# Patient Record
Sex: Female | Born: 1945 | Race: Black or African American | Hispanic: No | Smoking: Never smoker
Health system: Southern US, Community
[De-identification: ages and names within clinical notes are randomized; demographics above are authoritative.]

## PROBLEM LIST (undated history)

## (undated) DIAGNOSIS — N189 Chronic kidney disease, unspecified: Secondary | ICD-10-CM

## (undated) DIAGNOSIS — E785 Hyperlipidemia, unspecified: Secondary | ICD-10-CM

## (undated) DIAGNOSIS — I10 Essential (primary) hypertension: Secondary | ICD-10-CM

## (undated) HISTORY — DX: Chronic kidney disease, unspecified: N18.9

## (undated) HISTORY — DX: Hyperlipidemia, unspecified: E78.5

## (undated) HISTORY — DX: Essential (primary) hypertension: I10

---

## 2004-09-01 ENCOUNTER — Encounter: Admission: RE | Admit: 2004-09-01 | Discharge: 2004-11-30 | Payer: Self-pay | Admitting: Internal Medicine

## 2008-03-24 ENCOUNTER — Inpatient Hospital Stay (HOSPITAL_COMMUNITY): Admission: EM | Admit: 2008-03-24 | Discharge: 2008-04-04 | Payer: Self-pay | Admitting: Emergency Medicine

## 2008-04-01 ENCOUNTER — Encounter (INDEPENDENT_AMBULATORY_CARE_PROVIDER_SITE_OTHER): Payer: Self-pay | Admitting: Internal Medicine

## 2008-04-15 ENCOUNTER — Ambulatory Visit: Payer: Self-pay | Admitting: Internal Medicine

## 2008-05-02 ENCOUNTER — Ambulatory Visit: Payer: Self-pay | Admitting: Internal Medicine

## 2008-05-27 ENCOUNTER — Ambulatory Visit: Payer: Self-pay | Admitting: Internal Medicine

## 2008-08-23 ENCOUNTER — Ambulatory Visit: Payer: Self-pay | Admitting: Internal Medicine

## 2008-10-17 ENCOUNTER — Ambulatory Visit: Payer: Self-pay | Admitting: Internal Medicine

## 2009-03-07 ENCOUNTER — Ambulatory Visit: Payer: Self-pay | Admitting: Internal Medicine

## 2009-09-05 ENCOUNTER — Ambulatory Visit: Payer: Self-pay | Admitting: Internal Medicine

## 2010-04-02 ENCOUNTER — Other Ambulatory Visit: Payer: Self-pay | Admitting: Internal Medicine

## 2010-04-03 ENCOUNTER — Encounter (INDEPENDENT_AMBULATORY_CARE_PROVIDER_SITE_OTHER): Payer: Medicare Other | Admitting: Internal Medicine

## 2010-04-03 DIAGNOSIS — Z23 Encounter for immunization: Secondary | ICD-10-CM

## 2010-04-03 DIAGNOSIS — E119 Type 2 diabetes mellitus without complications: Secondary | ICD-10-CM

## 2010-04-03 DIAGNOSIS — I10 Essential (primary) hypertension: Secondary | ICD-10-CM

## 2010-04-27 ENCOUNTER — Ambulatory Visit: Payer: Medicare Other | Admitting: Internal Medicine

## 2010-05-14 LAB — RENAL FUNCTION PANEL
Albumin: 1.5 g/dL — ABNORMAL LOW (ref 3.5–5.2)
Albumin: 1.6 g/dL — ABNORMAL LOW (ref 3.5–5.2)
Albumin: 1.7 g/dL — ABNORMAL LOW (ref 3.5–5.2)
BUN: 50 mg/dL — ABNORMAL HIGH (ref 6–23)
BUN: 68 mg/dL — ABNORMAL HIGH (ref 6–23)
CO2: 19 mEq/L (ref 19–32)
Calcium: 8.1 mg/dL — ABNORMAL LOW (ref 8.4–10.5)
Calcium: 8.3 mg/dL — ABNORMAL LOW (ref 8.4–10.5)
Chloride: 119 mEq/L — ABNORMAL HIGH (ref 96–112)
Chloride: 121 mEq/L — ABNORMAL HIGH (ref 96–112)
Creatinine, Ser: 4.29 mg/dL — ABNORMAL HIGH (ref 0.4–1.2)
GFR calc Af Amer: 11 mL/min — ABNORMAL LOW (ref >60)
GFR calc Af Amer: 15 mL/min — ABNORMAL LOW (ref >60)
GFR calc non Af Amer: 12 mL/min — ABNORMAL LOW (ref >60)
Glucose, Bld: 200 mg/dL — ABNORMAL HIGH (ref 70–99)
Glucose, Bld: 262 mg/dL — ABNORMAL HIGH (ref 70–99)
Glucose, Bld: 92 mg/dL (ref 70–99)
Phosphorus: 3.8 mg/dL (ref 2.3–4.6)
Phosphorus: 4.1 mg/dL (ref 2.3–4.6)
Potassium: 3.2 mEq/L — ABNORMAL LOW (ref 3.5–5.1)
Potassium: 3.7 mEq/L (ref 3.5–5.1)
Sodium: 144 mEq/L (ref 135–145)
Sodium: 144 mEq/L (ref 135–145)
Sodium: 147 mEq/L — ABNORMAL HIGH (ref 135–145)

## 2010-05-14 LAB — GLUCOSE, CAPILLARY
Glucose-Capillary: 127 mg/dL — ABNORMAL HIGH (ref 70–99)
Glucose-Capillary: 132 mg/dL — ABNORMAL HIGH (ref 70–99)
Glucose-Capillary: 152 mg/dL — ABNORMAL HIGH (ref 70–99)
Glucose-Capillary: 174 mg/dL — ABNORMAL HIGH (ref 70–99)
Glucose-Capillary: 231 mg/dL — ABNORMAL HIGH (ref 70–99)
Glucose-Capillary: 233 mg/dL — ABNORMAL HIGH (ref 70–99)
Glucose-Capillary: 257 mg/dL — ABNORMAL HIGH (ref 70–99)
Glucose-Capillary: 270 mg/dL — ABNORMAL HIGH (ref 70–99)

## 2010-05-14 LAB — CBC
HCT: 29.5 % — ABNORMAL LOW (ref 36.0–46.0)
Hemoglobin: 10 g/dL — ABNORMAL LOW (ref 12.0–15.0)
MCHC: 34 g/dL (ref 30.0–36.0)
MCV: 75.9 fL — ABNORMAL LOW (ref 78.0–100.0)
MCV: 76.6 fL — ABNORMAL LOW (ref 78.0–100.0)
Platelets: 205 10*3/uL (ref 150–400)
Platelets: 216 10*3/uL (ref 150–400)
RBC: 3.89 MIL/uL (ref 3.87–5.11)
WBC: 12.1 10*3/uL — ABNORMAL HIGH (ref 4.0–10.5)
WBC: 12.3 10*3/uL — ABNORMAL HIGH (ref 4.0–10.5)
WBC: 12.5 10*3/uL — ABNORMAL HIGH (ref 4.0–10.5)

## 2010-05-14 LAB — BASIC METABOLIC PANEL
CO2: 17 mEq/L — ABNORMAL LOW (ref 19–32)
Calcium: 8.3 mg/dL — ABNORMAL LOW (ref 8.4–10.5)
Chloride: 117 mEq/L — ABNORMAL HIGH (ref 96–112)
Creatinine, Ser: 4.56 mg/dL — ABNORMAL HIGH (ref 0.4–1.2)
Glucose, Bld: 258 mg/dL — ABNORMAL HIGH (ref 70–99)
Sodium: 143 mEq/L (ref 135–145)

## 2010-05-14 LAB — URINALYSIS, MICROSCOPIC ONLY
Glucose, UA: NEGATIVE mg/dL
Ketones, ur: NEGATIVE mg/dL
Protein, ur: 30 mg/dL — AB
Urobilinogen, UA: 0.2 mg/dL (ref 0.0–1.0)

## 2010-05-14 LAB — IRON AND TIBC: Iron: 33 ug/dL — ABNORMAL LOW (ref 42–135)

## 2010-05-14 LAB — PTH, INTACT AND CALCIUM
Calcium, Total (PTH): 7.8 mg/dL — ABNORMAL LOW (ref 8.4–10.5)
PTH: 95.5 pg/mL — ABNORMAL HIGH (ref 14.0–72.0)

## 2010-05-14 LAB — ANTI-NEUTROPHIL ANTIBODY

## 2010-05-14 LAB — C3 COMPLEMENT: C3 Complement: 181 mg/dL (ref 88–201)

## 2010-05-14 LAB — PROTEIN, URINE, RANDOM: Total Protein, Urine: 62 mg/dL

## 2010-05-14 LAB — HEMOGLOBIN A1C: Hgb A1c MFr Bld: 14.3 % — ABNORMAL HIGH (ref 4.6–6.1)

## 2010-05-19 LAB — URINE MICROSCOPIC-ADD ON

## 2010-05-19 LAB — GLUCOSE, CAPILLARY
Glucose-Capillary: 109 mg/dL — ABNORMAL HIGH (ref 70–99)
Glucose-Capillary: 110 mg/dL — ABNORMAL HIGH (ref 70–99)
Glucose-Capillary: 127 mg/dL — ABNORMAL HIGH (ref 70–99)
Glucose-Capillary: 151 mg/dL — ABNORMAL HIGH (ref 70–99)
Glucose-Capillary: 159 mg/dL — ABNORMAL HIGH (ref 70–99)
Glucose-Capillary: 176 mg/dL — ABNORMAL HIGH (ref 70–99)
Glucose-Capillary: 187 mg/dL — ABNORMAL HIGH (ref 70–99)
Glucose-Capillary: 195 mg/dL — ABNORMAL HIGH (ref 70–99)
Glucose-Capillary: 243 mg/dL — ABNORMAL HIGH (ref 70–99)
Glucose-Capillary: 248 mg/dL — ABNORMAL HIGH (ref 70–99)
Glucose-Capillary: 266 mg/dL — ABNORMAL HIGH (ref 70–99)
Glucose-Capillary: 270 mg/dL — ABNORMAL HIGH (ref 70–99)
Glucose-Capillary: 291 mg/dL — ABNORMAL HIGH (ref 70–99)
Glucose-Capillary: 294 mg/dL — ABNORMAL HIGH (ref 70–99)
Glucose-Capillary: 296 mg/dL — ABNORMAL HIGH (ref 70–99)
Glucose-Capillary: 302 mg/dL — ABNORMAL HIGH (ref 70–99)
Glucose-Capillary: 306 mg/dL — ABNORMAL HIGH (ref 70–99)
Glucose-Capillary: 335 mg/dL — ABNORMAL HIGH (ref 70–99)
Glucose-Capillary: 344 mg/dL — ABNORMAL HIGH (ref 70–99)
Glucose-Capillary: 347 mg/dL — ABNORMAL HIGH (ref 70–99)
Glucose-Capillary: 363 mg/dL — ABNORMAL HIGH (ref 70–99)
Glucose-Capillary: 370 mg/dL — ABNORMAL HIGH (ref 70–99)
Glucose-Capillary: 400 mg/dL — ABNORMAL HIGH (ref 70–99)
Glucose-Capillary: 497 mg/dL — ABNORMAL HIGH (ref 70–99)
Glucose-Capillary: 84 mg/dL (ref 70–99)
Glucose-Capillary: 99 mg/dL (ref 70–99)
Glucose-Capillary: >600 mg/dL (ref 70–99)
Glucose-Capillary: >600 mg/dL (ref 70–99)
Glucose-Capillary: >600 mg/dL (ref 70–99)
Glucose-Capillary: >600 mg/dL (ref 70–99)

## 2010-05-19 LAB — BASIC METABOLIC PANEL
BUN: 34 mg/dL — ABNORMAL HIGH (ref 6–23)
BUN: 36 mg/dL — ABNORMAL HIGH (ref 6–23)
BUN: 36 mg/dL — ABNORMAL HIGH (ref 6–23)
BUN: 38 mg/dL — ABNORMAL HIGH (ref 6–23)
BUN: 39 mg/dL — ABNORMAL HIGH (ref 6–23)
BUN: 43 mg/dL — ABNORMAL HIGH (ref 6–23)
BUN: 47 mg/dL — ABNORMAL HIGH (ref 6–23)
BUN: 48 mg/dL — ABNORMAL HIGH (ref 6–23)
BUN: 57 mg/dL — ABNORMAL HIGH (ref 6–23)
BUN: 69 mg/dL — ABNORMAL HIGH (ref 6–23)
BUN: 76 mg/dL — ABNORMAL HIGH (ref 6–23)
BUN: 78 mg/dL — ABNORMAL HIGH (ref 6–23)
BUN: 94 mg/dL — ABNORMAL HIGH (ref 6–23)
CO2: 14 mEq/L — ABNORMAL LOW (ref 19–32)
CO2: 16 mEq/L — ABNORMAL LOW (ref 19–32)
CO2: 18 mEq/L — ABNORMAL LOW (ref 19–32)
CO2: 18 mEq/L — ABNORMAL LOW (ref 19–32)
CO2: 19 mEq/L (ref 19–32)
CO2: 19 mEq/L (ref 19–32)
CO2: 19 mEq/L (ref 19–32)
CO2: 20 mEq/L (ref 19–32)
CO2: 20 mEq/L (ref 19–32)
CO2: 20 mEq/L (ref 19–32)
CO2: 21 mEq/L (ref 19–32)
Calcium: 7.8 mg/dL — ABNORMAL LOW (ref 8.4–10.5)
Calcium: 7.8 mg/dL — ABNORMAL LOW (ref 8.4–10.5)
Calcium: 8 mg/dL — ABNORMAL LOW (ref 8.4–10.5)
Calcium: 8.1 mg/dL — ABNORMAL LOW (ref 8.4–10.5)
Calcium: 8.1 mg/dL — ABNORMAL LOW (ref 8.4–10.5)
Calcium: 8.2 mg/dL — ABNORMAL LOW (ref 8.4–10.5)
Calcium: 8.2 mg/dL — ABNORMAL LOW (ref 8.4–10.5)
Calcium: 8.3 mg/dL — ABNORMAL LOW (ref 8.4–10.5)
Calcium: 8.3 mg/dL — ABNORMAL LOW (ref 8.4–10.5)
Calcium: 8.4 mg/dL (ref 8.4–10.5)
Calcium: 8.6 mg/dL (ref 8.4–10.5)
Calcium: 8.6 mg/dL (ref 8.4–10.5)
Calcium: 8.7 mg/dL (ref 8.4–10.5)
Calcium: 8.8 mg/dL (ref 8.4–10.5)
Chloride: 112 mEq/L (ref 96–112)
Chloride: 117 mEq/L — ABNORMAL HIGH (ref 96–112)
Chloride: 119 mEq/L — ABNORMAL HIGH (ref 96–112)
Chloride: 119 mEq/L — ABNORMAL HIGH (ref 96–112)
Chloride: 120 mEq/L — ABNORMAL HIGH (ref 96–112)
Chloride: 121 mEq/L — ABNORMAL HIGH (ref 96–112)
Chloride: 121 mEq/L — ABNORMAL HIGH (ref 96–112)
Chloride: 126 mEq/L — ABNORMAL HIGH (ref 96–112)
Chloride: >130 mEq/L — ABNORMAL HIGH (ref 96–112)
Chloride: >130 mEq/L — ABNORMAL HIGH (ref 96–112)
Chloride: >130 mEq/L — ABNORMAL HIGH (ref 96–112)
Chloride: >130 mEq/L — ABNORMAL HIGH (ref 96–112)
Chloride: >130 mEq/L — ABNORMAL HIGH (ref 96–112)
Chloride: >130 mEq/L — ABNORMAL HIGH (ref 96–112)
Creatinine, Ser: 2.55 mg/dL — ABNORMAL HIGH (ref 0.4–1.2)
Creatinine, Ser: 2.7 mg/dL — ABNORMAL HIGH (ref 0.4–1.2)
Creatinine, Ser: 2.78 mg/dL — ABNORMAL HIGH (ref 0.4–1.2)
Creatinine, Ser: 2.84 mg/dL — ABNORMAL HIGH (ref 0.4–1.2)
Creatinine, Ser: 3.19 mg/dL — ABNORMAL HIGH (ref 0.4–1.2)
Creatinine, Ser: 3.21 mg/dL — ABNORMAL HIGH (ref 0.4–1.2)
Creatinine, Ser: 3.4 mg/dL — ABNORMAL HIGH (ref 0.4–1.2)
Creatinine, Ser: 3.6 mg/dL — ABNORMAL HIGH (ref 0.4–1.2)
Creatinine, Ser: 3.74 mg/dL — ABNORMAL HIGH (ref 0.4–1.2)
Creatinine, Ser: 3.78 mg/dL — ABNORMAL HIGH (ref 0.4–1.2)
Creatinine, Ser: 5.26 mg/dL — ABNORMAL HIGH (ref 0.4–1.2)
GFR calc Af Amer: 10 mL/min — ABNORMAL LOW (ref >60)
GFR calc Af Amer: 10 mL/min — ABNORMAL LOW (ref >60)
GFR calc Af Amer: 13 mL/min — ABNORMAL LOW (ref >60)
GFR calc Af Amer: 15 mL/min — ABNORMAL LOW (ref >60)
GFR calc Af Amer: 15 mL/min — ABNORMAL LOW (ref >60)
GFR calc Af Amer: 15 mL/min — ABNORMAL LOW (ref >60)
GFR calc Af Amer: 17 mL/min — ABNORMAL LOW (ref >60)
GFR calc Af Amer: 17 mL/min — ABNORMAL LOW (ref >60)
GFR calc Af Amer: 18 mL/min — ABNORMAL LOW (ref >60)
GFR calc Af Amer: 21 mL/min — ABNORMAL LOW (ref >60)
GFR calc Af Amer: 21 mL/min — ABNORMAL LOW (ref >60)
GFR calc Af Amer: 22 mL/min — ABNORMAL LOW (ref >60)
GFR calc Af Amer: 22 mL/min — ABNORMAL LOW (ref >60)
GFR calc Af Amer: 23 mL/min — ABNORMAL LOW (ref >60)
GFR calc non Af Amer: 11 mL/min — ABNORMAL LOW (ref >60)
GFR calc non Af Amer: 12 mL/min — ABNORMAL LOW (ref >60)
GFR calc non Af Amer: 12 mL/min — ABNORMAL LOW (ref >60)
GFR calc non Af Amer: 13 mL/min — ABNORMAL LOW (ref >60)
GFR calc non Af Amer: 14 mL/min — ABNORMAL LOW (ref >60)
GFR calc non Af Amer: 15 mL/min — ABNORMAL LOW (ref >60)
GFR calc non Af Amer: 17 mL/min — ABNORMAL LOW (ref >60)
GFR calc non Af Amer: 17 mL/min — ABNORMAL LOW (ref >60)
GFR calc non Af Amer: 18 mL/min — ABNORMAL LOW (ref >60)
GFR calc non Af Amer: 18 mL/min — ABNORMAL LOW (ref >60)
GFR calc non Af Amer: 19 mL/min — ABNORMAL LOW (ref >60)
GFR calc non Af Amer: 19 mL/min — ABNORMAL LOW (ref >60)
GFR calc non Af Amer: 8 mL/min — ABNORMAL LOW (ref >60)
GFR calc non Af Amer: 9 mL/min — ABNORMAL LOW (ref >60)
Glucose, Bld: 197 mg/dL — ABNORMAL HIGH (ref 70–99)
Glucose, Bld: 201 mg/dL — ABNORMAL HIGH (ref 70–99)
Glucose, Bld: 203 mg/dL — ABNORMAL HIGH (ref 70–99)
Glucose, Bld: 215 mg/dL — ABNORMAL HIGH (ref 70–99)
Glucose, Bld: 253 mg/dL — ABNORMAL HIGH (ref 70–99)
Glucose, Bld: 254 mg/dL — ABNORMAL HIGH (ref 70–99)
Glucose, Bld: 280 mg/dL — ABNORMAL HIGH (ref 70–99)
Glucose, Bld: 375 mg/dL — ABNORMAL HIGH (ref 70–99)
Glucose, Bld: 432 mg/dL — ABNORMAL HIGH (ref 70–99)
Glucose, Bld: 482 mg/dL — ABNORMAL HIGH (ref 70–99)
Glucose, Bld: 522 mg/dL (ref 70–99)
Potassium: 3.4 mEq/L — ABNORMAL LOW (ref 3.5–5.1)
Potassium: 3.6 mEq/L (ref 3.5–5.1)
Potassium: 3.7 mEq/L (ref 3.5–5.1)
Potassium: 3.8 mEq/L (ref 3.5–5.1)
Potassium: 3.9 mEq/L (ref 3.5–5.1)
Potassium: 4 mEq/L (ref 3.5–5.1)
Potassium: 4.2 mEq/L (ref 3.5–5.1)
Potassium: 4.3 mEq/L (ref 3.5–5.1)
Potassium: 4.3 mEq/L (ref 3.5–5.1)
Potassium: 4.4 mEq/L (ref 3.5–5.1)
Potassium: 4.5 mEq/L (ref 3.5–5.1)
Potassium: 4.8 mEq/L (ref 3.5–5.1)
Potassium: 5 mEq/L (ref 3.5–5.1)
Potassium: 5.7 mEq/L — ABNORMAL HIGH (ref 3.5–5.1)
Sodium: 136 mEq/L (ref 135–145)
Sodium: 136 mEq/L (ref 135–145)
Sodium: 141 mEq/L (ref 135–145)
Sodium: 149 mEq/L — ABNORMAL HIGH (ref 135–145)
Sodium: 150 mEq/L — ABNORMAL HIGH (ref 135–145)
Sodium: 152 mEq/L — ABNORMAL HIGH (ref 135–145)
Sodium: 160 mEq/L — ABNORMAL HIGH (ref 135–145)
Sodium: 160 mEq/L — ABNORMAL HIGH (ref 135–145)
Sodium: 161 mEq/L (ref 135–145)
Sodium: 161 mEq/L (ref 135–145)
Sodium: 163 mEq/L (ref 135–145)
Sodium: 165 mEq/L (ref 135–145)
Sodium: 173 mEq/L (ref 135–145)

## 2010-05-19 LAB — MAGNESIUM
Magnesium: 2.3 mg/dL (ref 1.5–2.5)
Magnesium: 3.4 mg/dL — ABNORMAL HIGH (ref 1.5–2.5)
Magnesium: 3.5 mg/dL — ABNORMAL HIGH (ref 1.5–2.5)
Magnesium: 3.6 mg/dL — ABNORMAL HIGH (ref 1.5–2.5)

## 2010-05-19 LAB — URINALYSIS, ROUTINE W REFLEX MICROSCOPIC
Bilirubin Urine: NEGATIVE
Glucose, UA: 250 mg/dL — AB
Ketones, ur: 15 mg/dL — AB
Ketones, ur: 15 mg/dL — AB
Ketones, ur: NEGATIVE mg/dL
Leukocytes, UA: NEGATIVE
Nitrite: NEGATIVE
Nitrite: POSITIVE — AB
Protein, ur: 100 mg/dL — AB
Protein, ur: 100 mg/dL — AB
Protein, ur: NEGATIVE mg/dL
Urobilinogen, UA: 0.2 mg/dL (ref 0.0–1.0)
Urobilinogen, UA: 0.2 mg/dL (ref 0.0–1.0)
pH: 6 (ref 5.0–8.0)

## 2010-05-19 LAB — CBC
HCT: 54.8 % — ABNORMAL HIGH (ref 36.0–46.0)
MCV: 85.1 fL (ref 78.0–100.0)
Platelets: 148 10*3/uL — ABNORMAL LOW (ref 150–400)
RBC: 6.44 MIL/uL — ABNORMAL HIGH (ref 3.87–5.11)
RDW: 15.3 % (ref 11.5–15.5)
WBC: 13.3 10*3/uL — ABNORMAL HIGH (ref 4.0–10.5)
WBC: 9.1 10*3/uL (ref 4.0–10.5)

## 2010-05-19 LAB — POCT I-STAT, CHEM 8
Creatinine, Ser: 4.8 mg/dL — ABNORMAL HIGH (ref 0.4–1.2)
HCT: 57 % — ABNORMAL HIGH (ref 36.0–46.0)
Hemoglobin: 19.4 g/dL — ABNORMAL HIGH (ref 12.0–15.0)
Potassium: 5.7 mEq/L — ABNORMAL HIGH (ref 3.5–5.1)
Sodium: 149 mEq/L — ABNORMAL HIGH (ref 135–145)
TCO2: 10 mmol/L (ref 0–100)

## 2010-05-19 LAB — LIPASE, BLOOD
Lipase: 170 U/L — ABNORMAL HIGH (ref 11–59)
Lipase: 55 U/L (ref 11–59)

## 2010-05-19 LAB — BLOOD GAS, ARTERIAL
Acid-base deficit: 4.6 mmol/L — ABNORMAL HIGH (ref 0.0–2.0)
O2 Content: 2 L/min
O2 Saturation: 97.3 %
Patient temperature: 98.6
TCO2: 20.5 mmol/L (ref 0–100)

## 2010-05-19 LAB — URINE CULTURE
Colony Count: >=100000
Colony Count: >=100000
Colony Count: NO GROWTH
Culture: NO GROWTH

## 2010-05-19 LAB — UIFE/LIGHT CHAINS/TP QN, 24-HR UR
Alpha 1, Urine: DETECTED — AB
Alpha 2, Urine: DETECTED — AB
Beta, Urine: DETECTED — AB
Free Kappa Lt Chains,Ur: 14.1 mg/dL — ABNORMAL HIGH (ref 0.04–1.51)
Free Kappa/Lambda Ratio: 3.18 ratio (ref 0.46–4.00)
Gamma Globulin, Urine: DETECTED — AB
Volume, Urine: 1350 mL

## 2010-05-19 LAB — DIFFERENTIAL
Eosinophils Absolute: 0 10*3/uL (ref 0.0–0.7)
Eosinophils Relative: 0 % (ref 0–5)
Lymphs Abs: 1 10*3/uL (ref 0.7–4.0)
Monocytes Absolute: 0.8 10*3/uL (ref 0.1–1.0)
Monocytes Relative: 6 % (ref 3–12)

## 2010-05-19 LAB — PHOSPHORUS
Phosphorus: 1.6 mg/dL — ABNORMAL LOW (ref 2.3–4.6)
Phosphorus: 1.9 mg/dL — ABNORMAL LOW (ref 2.3–4.6)
Phosphorus: 1.9 mg/dL — ABNORMAL LOW (ref 2.3–4.6)
Phosphorus: 2.6 mg/dL (ref 2.3–4.6)
Phosphorus: 2.9 mg/dL (ref 2.3–4.6)

## 2010-05-19 LAB — CREATININE, URINE, 24 HOUR
Collection Interval-UCRE24: 24 hours
Creatinine, Urine: 47.3 mg/dL

## 2010-05-19 LAB — HEPATIC FUNCTION PANEL
Albumin: 4.2 g/dL (ref 3.5–5.2)
Alkaline Phosphatase: 181 U/L — ABNORMAL HIGH (ref 39–117)
Indirect Bilirubin: 0.6 mg/dL (ref 0.3–0.9)
Total Bilirubin: 0.7 mg/dL (ref 0.3–1.2)

## 2010-05-19 LAB — CULTURE, BLOOD (ROUTINE X 2)
Culture: NO GROWTH
Culture: NO GROWTH

## 2010-05-19 LAB — TSH: TSH: 1.434 u[IU]/mL (ref 0.350–4.500)

## 2010-05-19 LAB — POCT CARDIAC MARKERS: Myoglobin, poc: >500 ng/mL (ref 12–200)

## 2010-05-19 LAB — PROTEIN, URINE, 24 HOUR
Collection Interval-UPROT: 24 hours
Urine Total Volume-UPROT: 1350 mL

## 2010-05-19 LAB — CHLORIDE, URINE, RANDOM: Chloride Urine: 35 mEq/L

## 2010-05-19 LAB — OSMOLALITY: Osmolality: 454 mOsm/kg — ABNORMAL HIGH (ref 275–300)

## 2010-05-19 LAB — GLUCOSE, RANDOM: Glucose, Bld: 1371 mg/dL (ref 70–99)

## 2010-05-19 LAB — HEPATITIS B SURFACE ANTIGEN: Hepatitis B Surface Ag: NEGATIVE

## 2010-06-01 ENCOUNTER — Ambulatory Visit (INDEPENDENT_AMBULATORY_CARE_PROVIDER_SITE_OTHER): Payer: Medicare Other | Admitting: Internal Medicine

## 2010-06-01 DIAGNOSIS — I10 Essential (primary) hypertension: Secondary | ICD-10-CM

## 2010-06-16 NOTE — Consult Note (Signed)
NAME:  Erin [name], Erin [name] NO.:  [id number]   MEDICAL RECORD NO.:  [id number]          PATIENT TYPE:  INP   LOCATION:  5506                         FACILITY:  [facility]   PHYSICIAN:  Maree Krabbe, M.D.DATE OF BIRTH:  1945-09-11   DATE OF CONSULTATION:  DATE OF DISCHARGE:                                 CONSULTATION   REASON FOR CONSULTATION:  Elevated creatinine.   HISTORY OF PRESENT ILLNESS:  The patient is a 65 year old African  American female who was admitted with a new diagnosis of diabetes and  ketoacidosis.  She apparently was not on any medications and has been  entirely healthy according to the history and physical.  She developed  severe weakness at home and could not get out of bed.  She was brought  to the emergency room by her family.  She had a blood pressure of 90/50  in the emergency room and creatinine of 5.2, BUN of 94.  Chest x-ray was  entirely normal.  Urinalysis initially showed no protein, 3-6 red blood  cells.  Ultrasound of the kidneys shows 8.7 and 8.8 cm small echogenic  kidneys with multiple bilateral renal cysts consistent with advanced  chronic kidney disease.   The patient denies any history of kidney failure.  She denies any really  significant medical history.  States she has not ever had any previous  surgery.  She is widowed.  She lives with her daughter.  She never  smoked or drank.   PAST MEDICAL HISTORY:  Really unremarkable.   MEDICATIONS:  None at home.  She is on Pepcid here, subcutaneous  heparin, insulin, 34 units of Lantus with sliding scale, Kayexalate  p.r.n. and Zofran p.r.n.   ALLERGIES:  No drug allergies.   PAST SURGICAL HISTORY:  No surgical history.   SOCIAL HISTORY:  As above.   REVIEW OF SYSTEMS:  Denies fever, chills, sweats, weight loss, sore  throat, difficulty swallowing, chest pain, shortness of breath.  No  diarrhea.  She has had some vomiting here in the hospital.  Denies  abdominal pain.  Denies  any history of arthritis, joint pain swelling,  or ankle swelling.  No history of stroke, TIA, or seizure.   PHYSICAL EXAMINATION:  VITAL SIGNS:  Blood pressure 156/78, pulse 70,  respirations 20, temperature 98.7, and 100% sat on room air.  GENERAL:  This is an adult [place] American female in no distress, lying  flat.  She is weak.  She cannot hold herself up in a sitting position.  She is alert and fully oriented.  She is in no distress.  SKIN:  Warm and dry.  HEENT:  PERRLA.  EOMI.  Throat was clear and moist.  NECK:  Supple with flat neck veins.  CHEST:  Clear clear throughout at the bases.  CARDIAC:  Regular rate and rhythm without murmur, rub, or gallop.  ABDOMEN:  Soft, nontender.  No bruits are heard.  No organomegaly is  noted.  There is no ascites.  VASCULAR:  She has no bruits over femoral pulses.  No abdominal bruits.  EXTREMITIES:  She has 1+ pitting edema in the lower legs bilaterally and  symmetric.  There is no edema elsewhere.  NEUROLOGIC:  Alert and oriented x3.  No asterixis.   LABORATORY DATA:  Initial urinalysis showed no protein, 3-6 red blood  cells.  Subsequent urinalysis was infected and grew out Klebsiella on  the urine culture.  The BUN on admission was 5.26, it got down as low as  2.5 and then it has creeped back up into the mid 3.30 yesterday and 5.01  today.  Estimated clearance was 11 today on today's sample.  She did  have a period of hypernatremia with serum sodium into the 170s which has  been corrected.  Currently, sodium 136, potassium 5.3, CO2 14, chloride  114, anion gap is 8, calcium is 7.8, phosphorous 2.9, magnesium 2.3.  I  do not see an albumin in the chart.  Urine osmolality going back several  days was 474.  Chest x-ray was unremarkable.   IMPRESSION:  1. Chronic kidney disease which is advanced with small echogenic      kidneys with multiple bilateral cysts.  This is a finding that you      would typically see in advanced chronic kidney  disease.  She may      have had underlying chronic untreated glomerulonephritis, untreated      hypertension.  We do not have much history on her.  I do feel,      however, that this is not acute.  We need to rule out multiple      myeloma as one course, but with a negative protein on the initial      urinalysis, I do not feel a serologic examination is really      warranted.  I suspect she will end up on dialysis during this      hospitalization but we will try to get some more information from      her outpatient doctor if she had any significant visits there.  2. New-onset diabetes.  3. Volume status appears euvolemic on exam.  4. Urinary tract infection, on antibiotics.      Maree Krabbe, M.D.  Electronically Signed     RDS/MEDQ  D:  03/31/2008  T:  04/01/2008  Job:  [id number]

## 2010-06-16 NOTE — Group Therapy Note (Signed)
NAME:  Erin [name], [name] NO.:  [id number]   MEDICAL RECORD NO.:  [id number]          PATIENT TYPE:  INP   LOCATION:  5506                         FACILITY:  [facility]   PHYSICIAN:  Charlestine Massed, MDDATE OF BIRTH:  1945-06-04                                 PROGRESS NOTE   SUBJECTIVE:  Erin [name] [name] is a 65 year old female who has been following with Dr.  Lenord Fellers for primary care, was brought in with extreme dehydration and  weakness, was found to be diagnosed with diabetes for the first time.  She was found to be in diabetic ketoacidosis with the hyperosmolar state  and blood sugar at 1212.  She was admitted to the step-down unit  initially for management of the DKA and was kept on insulin drip and she  kept her insulin drip and she was managed for DKA.   She was found to have a very very high creatinine of 5.27 on admission.  That was the time when she was extremely dehydrated, due to the diabetic  ketoacidosis and hyperosmolar state.  So, she was managed with IV fluids  following which creatinine came from 5.26 to 3.78, and it came all way  down to 2.55.  The creatinine came down to 2.78, and then the patient  had a rise in the creatinine over the past 2 days following which renal  consult was called.  Renal fellows are following him.  The creatinine  came up to 5.01, and they are starting to come down.  Blood sugars are  currently controlled, and the patient is currently controlled.  The  patient is stable, clinically there is no signs of fluid overload.   Today, we spoke with the patient.  She is comfortable.  No complaints.  Her urinary bag shows clear urine draining.  Her urine output has  increased over the past 24 hours from 175 ml over one day to an amount  of input 2180 and an output of 2025.   PHYSICAL EXAMINATION:  VITAL SIGNS:  Temperature 98.5, heart rate 66, respirations 16, blood  pressure 128/58.  HEENT:  Pupils reactive to light.  No JVD.  No  bruits heard in the neck.  The tongue is moist.  CHEST:  Bilateral air entry is good anteriorly and posteriorly.  Clear  to auscultation.  No rales or wheeze.  CARDIAC:  S1-S2 heard.  Heart rate is within normal limits and no  murmurs appreciated.  ABDOMEN:  Soft, nontender, no organomegaly.  Bowel sounds normally  heard.  No costovertebral angle tenderness.  EXTREMITIES:  There was  trace pedal edema bilaterally which has been there for a few days.  No  tenderness or erythema in the calf.  CNS:  Alert and oriented x3.  No motor or sensory deficits.   LABORATORY DATA:  BMP today:  Sodium 144, potassium 3.7, chloride 122, bicarb 15, glucose  240, BUN 70, creatinine 4.9, creatinine was 5.06 yesterday.  It came up  from 3.3 on February 27 to 5.01 on February 28.  The evening of February  28, it was 5.06, and now  it is 4.9.  Even though there is not much of a  difference between 5.06 to 4.9, the rise in the creatinine is not seen  anymore as well as there is no potassium being accumulated. The  potassium has come down from 5.3 to 3.7 on the Kayexalate, and there is  a good urine output now.  Phosphorus 4.1, calcium 8.2, albumin 1.5.  UA  done yesterday shows numerous WBCs which is due to the existing UTI for  which she has been treated, but there is no report about eosinophils in  the urine which was asked for and no report of any casts in the urine.   ASSESSMENT/PLAN:  1. Acute renal failure on existing chronic renal disease.  Renal      consult was done.  Seen by Dr. Arlean Hopping who has opened that the      patient has advanced kidney disease on sonogram with shrunken      echogenic kidney disease.  Origin of the prior kidney disease is      not well known as the patient is not a known hypertensive, and has      not been on any medications so far.  As per Nephrology, possibly      the patient has some glomerulonephritis which has been left      untreated, so the patient is expected to have  high baseline chronic      renal insufficiency.  The current renal insufficiency on an      existing chronic renal insufficiency could be possibly due to an      interstitial nephritis of ceftriaxone which was stopped yesterday,      after which the rise in creatinine has not been seen.  We are      repeating the urine to check urine for eosinophils, but the patient      has started making good urine now.  The output has gone up from 175      ml per day to 2025 ml per day.  There is no fever.  There is no      evidence of fluid overload.  The chest is clear of any condition,      and the patient is comfortable.  2. Diabetes mellitus.  Currently blood sugars are fairly controlled.      She is currently on Lantus 34 units at night with NovoLog coverage      with meals and moderate scale coverage.  Blood sugars are      controlled.  Will continue following to monitor blood sugar levels.  3. Urinary tract infection.  She had fever and few days ago after      which she was found to be positive on the UA.  Was started on      ceftriaxone due to the recently increased presence of quinolone      resistant E-coli and hospital-acquired urinary tract infections.      Patient's urine culture came back as negative, but the patient has      no spikes so far, has completed five days of ceftriaxone and was      discontinued yesterday.  4. Currently, the patient is afebrile.  Will continue watching the      patient.  5. Deep vein thrombosis prophylaxis with heparin.  6. Gastrointestinal prophylaxis.  Protonix was also stopped, and has      been changed to Pepcid 20 mg daily in view of the fact that the  patient was having possible early acute renal failure and possible      source of intestine nephritis needs to be taken off.  So she is      currently on famotine 20 daily.   DISPOSITION:  Renal to follow for the renal failure.  We will look for the trend in  the BUN and creatinine to see the  nature of the patient's renal Illness.  The patient may need dialysis in the future or during this admission.  Depending on the progress.      Charlestine Massed, MD  Electronically Signed     UT/MEDQ  D:  04/01/2008  T:  04/01/2008  Job:  [id number]   cc:   Luanna Cole. Lenord Fellers, M.D.  Fax: [phone]   Maree Krabbe, M.D.  Fax: [phone]

## 2010-06-16 NOTE — H&P (Signed)
NAME:  Erin, [name] NO.:  [id number]   MEDICAL RECORD NO.:  [id number]          PATIENT TYPE:  EMS   LOCATION:  MAJO                         FACILITY:  [facility]   PHYSICIAN:  Charlestine Massed, MDDATE OF BIRTH:  1945-09-15   DATE OF ADMISSION:  03/24/2008  DATE OF DISCHARGE:                              HISTORY & PHYSICAL   PRIMARY CARE PHYSICIAN:  Dr. Luanna Cole. [place].   CHIEF COMPLAINT:  Altered mental status, confusion, and decreased p.o.  intake over the past 2 days.   HISTORY OF PRESENT ILLNESS:  Ms. Erin [name] is a 65 year old African  American female who has no significant past medical history and was not  on any regular medication who was brought in by family because she has  been acting differently over the past 2 days and mental status was  changed considerably and she has been more confused and getting more  weak and as the day went on.  She is unable to take care of herself and  she is not even able to get out of bed.  History was mainly given by the  family.  The patient too was extremely weak to give a proper history.  History was done from the family as well as from the notes in the  emergency room.  There was no considerable history suggestive of any  chest pain.  No nausea or vomiting or diarrhea.  No loss of  consciousness.  No fall.  No fever no chills.  No sputum production.  No  cough.  The patient has never had any issues medically.  She is a very  active female.  She drives.  She lives with her youngest daughter in her  own home in [place].   There was no history suggestive of any seizures.  No history suggestive  of any head trauma.  The patient was awake and responding to verbal  commands and answering a few questions in 1 to 2 words, but in my  opinion, she is not strong enough to speak clearly at this moment even  though her mentation is partially okay.   PAST HISTORY:  No significant past medical history.   CURRENT  MEDICATIONS:  No regular medications.   ALLERGIES:  No known drug allergies.   SOCIAL HISTORY:  Never smoked in her life.  Not an alcoholic.  No drugs.  She lives in her own house with her youngest daughter in [place].  She drives to stores and does grocery shopping herself.   FAMILY HISTORY:  Noncontributory.  No history of sudden death in the  family.   REVIEW OF SYSTEMS:  Partial 12-point review of all systems was done.  Positive pertinent as mentioned in the history of present illness and is  negative otherwise.   PHYSICAL EXAM:  VITAL SIGNS:  Blood pressure 144/66, heart rate is 92,  respirations 16, O2 saturation 100% on 2L nasal oxygen.  Temperature  97.6 degrees Fahrenheit oral.   GENERAL:  The patient is awake, partially alert, not combative.  Can  answer questions in 1 to 2 words.  Does not understand every question  asked, and is fatigued, too weak to even set up.  Afebrile.  HEAD AND NECK:  Pupils are bilaterally equally reacting to light.  The  oral mucosa does not reveal any bleed.  Tongue is dry.  Capillary  anomalies seen.  There is no erythema seen in the throat and there is no  evidence of caries.  No bleeding seen in the nasal mucosa.  NECK:  Supple with no JVD seen.  No bruit heard.  CHEST:  Bilateral air entry is good anteriorly and posteriorly.  No  rales or wheeze heard.  CARDIAC:  S1, S2 heard and regular.  No murmurs appreciated.  ABDOMEN:  Soft, nondistended, nontender.  No organomegaly.  Bowel sounds  are normally heard gurgling.  No costovertebral angle tenderness.  EXTREMITIES:  No pedal edema.  No tenderness or erythema in the calf.  CNS:  She is partially awake, not oriented.  Moves all 4 extremities and  no obvious motor or sensory deficits seen.  Full neuro exam could not be  accomplished at this time.  PSYCH:  Further psych evaluation could not be done and this time.  MUSCULOSKELETAL:  Proper musculoskeletal evaluation could not be done at   this time due to the patient being extremely fatigued.   LABS:  CT head without contrast was done in the emergency room which  does not reveal any acute abnormalities, diffuse cortical atrophy.   Chest x-ray done, no acute cardiopulmonary process.  New effusions, no  cardiomegaly and no evidence of any pneumonia.   CBC reveals WBC of 13.3, hemoglobin 19.4, hematocrit of 57, and platelet  count 285,000, neutrophils 87%, no bands.  Chemistry, sodium 156,  potassium 5.7, chloride 112, bicarb 80, glucose 1212, BUN 94, creatinine  4.26 and calcium 9.2, ionized calcium 1.03.  Liver function testing,  total bilirubin 0.7, direct is 0.1, alkaline phosphatase 181, AST 18,  ALT 17 total protein 8.1, albumin 4.2.  Lipase is 170.  Troponin less  than 0.05.  UA is negative for urinary tract infection.  Very trace amount of blood,  3 to 6 RBCs seen.  Her EKG reveals a normal sinus rhythm at a rate of 96 per minute.  There  is hyperdynamic state seen.  The voltages, I believe, are due to the  hyperdynamic state of the heart.  There are tall T waves in lead 2 and  lead 3, which are nonspecific.  No chamber enlargement.  Slightly higher  P-waves in lead 2, or could be possibly due to right atrial enlargement,  but still not specific at this time, and no acute ST or T-wave changes  suggestive of any acute ischemia at this time.  No PVCs.   ASSESSMENT/PLAN:  1. Newly diagnosed diabetes mellitus.  2. Diabetic ketoacidosis with hyperosmolar state.  3. Altered mental status secondary to #1 and #2.  4. Possible pancreatitis to be ruled out.   PLAN:  1. With regards to diabetes, the patient has newly diagnosed diabetes,      already has received 2 liters of normal saline in the emergency      room, to continue normal saline at 250 mL an hour.  Foley will be      replaced to watch input and output for charting.  The patient is on      insulin drip, to continue insulin drip as per the required       protocol.  Will check fingerstick q. hourly and insulin  drip to be      adjusted as per the fingerstick.  The expected rate of lowering of      blood sugar is supposed to be 100 mg/dL per hour.  BMP, mag and      phos to be checked q.4 hourly.  After the first 4 hours, will      change the IV fluids to half normal saline and continue the same      rate.  Once the blood sugar is less than 250, will change IV fluids      to D5 half-normal saline at the same rate.  If potassium falls      below 5.0 we will add 20 mg of potassium to IV drip and continue      it.  Will check phosphorus level and if phosphorus is found to be      low, will replete them also.  Nursing has been instructed to page      M.D. with every lab result they come across.  The patient be      admitted to the step-down unit for now and once her mental status      is improving will be transferred to the floor.  The patient will      need outpatient diabetic teaching and instructions to inject      herself with insulin during this hospital stay.  Once the 2 ion      gaps are normal, will continue IV insulin drip for some time and      start Lantus right away, and after the third set will discontinue      insulin drip and start the patient on p.o. feeds.  Diabetic diet to      be followed at that time, ADA 1800 kcal.  Diabetic teaching will be      instituted.  The patient needs to be taught to recognize the signs      and symptoms of hyperglycemia, to review the patient diabetic      instruction book, outpatient diabetic home care to be set up for      the initial few days to help the patient with coping after the      newly diagnosed diabetes.  Will send a copy of the discharge      summary at the time of discharge to the primary care physician who      will follow up with further outpatient diabetic management.  2. Possible pancreatitis.  Likely, this is nonspecific though the      patient does complain of some vague  discomfort in the abdomen.      Currently the patient is n.p.o.  Once the patient is stable, and if      she still has abdominal pain, will get a CAT scan to verify it      further.  Currently as the patient does not have any issues, will      follow it.  With get a sonogram of the right upper quadrant to look      for gallstones which, if she has pancreatitis, could be the cause      of this pain, and no further active management at this time as the      patient is currently getting critical management for diabetic      ketoacidosis.  3. DVT prophylaxis with Lovenox.  4. GI prophylaxis with Protonix.  5. Get 1 more sets of troponin I to follow the  pattern.  Otherwise,      the patient is stable cardiac wise.   A total of 90 minutes (1 hour 30 minutes) was spent on this admission.      Charlestine Massed, MD  Electronically Signed     UT/MEDQ  D:  03/24/2008  T:  03/24/2008  Job:  [id number]   cc:   Luanna Cole. Lenord Fellers, M.D.

## 2010-06-19 NOTE — Discharge Summary (Signed)
NAME:  Erin [name], WINDSOR NO.:  [id number]   MEDICAL RECORD NO.:  [id number]           PATIENT TYPE:   LOCATION:                                 FACILITY:   PHYSICIAN:  Theodosia Paling, MD    DATE OF BIRTH:  02-06-1945   DATE OF ADMISSION:  DATE OF DISCHARGE:                               DISCHARGE SUMMARY   ADDENDUM   In the interim, no new event has transpired.  Renal has followed the  patient and cleared the patient for discharge and diabetes was under  better control and hypokalemia was repleted.   DISCHARGE DIAGNOSES:  1. Acute on chronic renal failure.  2. Diabetes mellitus.  3. Urinary tract infection.  The patient received 5 day of Rocephin.      Culture was essentially negative.  4. Gastroesophageal reflux disease.   DISCHARGE MEDICATIONS:  1. Augmentin 500 mg p.o. q.12 h. for 1 week.  2. Famotidine 20 mg p.o. q.12 h. for 1 month.  3. Lantus 40 units subcu nightly.  4. NovoLog insulin 4 units subcu premeal.  5. Glucerna 1 can p.o. daily.   DISPOSITION:  The patient will be following up with [facility] on May 01, 2008 at 8:15 a.m.  The number is [phone].   TOTAL TIME SPENT IN DISCHARGE:  45 minutes.      Theodosia Paling, MD  Electronically Signed     Theodosia Paling, MD  Electronically Signed    NP/MEDQ  D:  06/13/2008  T:  06/14/2008  Job:  [id number]   cc:   Wilber Bihari. Caryn Section, M.D.

## 2010-08-10 ENCOUNTER — Other Ambulatory Visit: Payer: Self-pay | Admitting: *Deleted

## 2010-08-10 MED ORDER — INSULIN ASPART 100 UNIT/ML ~~LOC~~ SOLN: 4.0000 [IU] | Freq: Three times a day (TID) | SUBCUTANEOUS | Status: DC

## 2010-08-31 ENCOUNTER — Other Ambulatory Visit: Payer: Self-pay

## 2010-08-31 MED ORDER — INSULIN GLARGINE 100 UNIT/ML ~~LOC~~ SOLN: 40.0000 [IU] | Freq: Every day | SUBCUTANEOUS | Status: DC

## 2010-10-01 ENCOUNTER — Other Ambulatory Visit: Payer: Medicare Other | Admitting: Internal Medicine

## 2010-10-01 DIAGNOSIS — I10 Essential (primary) hypertension: Secondary | ICD-10-CM

## 2010-10-01 DIAGNOSIS — E119 Type 2 diabetes mellitus without complications: Secondary | ICD-10-CM

## 2010-10-01 LAB — BASIC METABOLIC PANEL
BUN: 21 mg/dL (ref 6–23)
CO2: 23 mEq/L (ref 19–32)
Calcium: 10.3 mg/dL (ref 8.4–10.5)
Creat: 1.71 mg/dL — ABNORMAL HIGH (ref 0.50–1.10)
Glucose, Bld: 173 mg/dL — ABNORMAL HIGH (ref 70–99)

## 2010-10-02 ENCOUNTER — Ambulatory Visit (INDEPENDENT_AMBULATORY_CARE_PROVIDER_SITE_OTHER): Payer: Medicare Other | Admitting: Internal Medicine

## 2010-10-02 ENCOUNTER — Encounter: Payer: Self-pay | Admitting: Internal Medicine

## 2010-10-02 VITALS — BP 156/74 | HR 100 | Temp 99.3°F | Ht 61.0 in | Wt 157.0 lb

## 2010-10-02 DIAGNOSIS — E119 Type 2 diabetes mellitus without complications: Secondary | ICD-10-CM

## 2010-10-02 DIAGNOSIS — N189 Chronic kidney disease, unspecified: Secondary | ICD-10-CM

## 2010-10-02 DIAGNOSIS — F411 Generalized anxiety disorder: Secondary | ICD-10-CM

## 2010-10-02 DIAGNOSIS — N184 Chronic kidney disease, stage 4 (severe): Secondary | ICD-10-CM | POA: Insufficient documentation

## 2010-10-02 DIAGNOSIS — Z23 Encounter for immunization: Secondary | ICD-10-CM

## 2010-10-02 DIAGNOSIS — F419 Anxiety disorder, unspecified: Secondary | ICD-10-CM | POA: Insufficient documentation

## 2010-10-02 DIAGNOSIS — I10 Essential (primary) hypertension: Secondary | ICD-10-CM

## 2010-10-02 MED ORDER — INFLUENZA VAC TYP A&B SURF ANT IM INJ: 0.5000 mL | INJECTION | Freq: Once | INTRAMUSCULAR | Status: DC

## 2010-10-02 NOTE — Progress Notes (Signed)
  Subjective:    Patient ID: Erin [name], female    DOB: February 06, 1945, 65 y.o.   MRN: [id number]  HPI patient in today for followup of hypertension, renal insufficiency and diabetes mellitus. Hemoglobin A1c drawn just this week is 7.5%. Patient says her Accu-Cheks generally run about 105 but fasting glucose yesterday was 179. Have her she had eaten chicken and Dopplers the evening before. Hemoglobin A1c is improved from several months ago when it was 8%. Patient complains of anxiety. Blood pressure rechecked after the initial presentation to the office and was 138/78. Reminded about diabetic eye exam. Has not been this year. No  diabetic foot issues. Influenza immunization administered    Review of Systems     Objective:   Physical Exam neck no JVD thyromegaly or carotid bruits; chest clear; cardiac exam regular rate and rhythm normal S1 and S2; extremities without edema, foot exam-no calluses, pulses are intact        Assessment & Plan:  Anxiety  Diabetes mellitus  Renal insufficiency  Hypertension  Patient given Xanax 0.5 mg #60 one half to one by mouth twice a day when necessary anxiety with 1 refill given samples of NovoLog to take 3 times a day before meals. Return in 6 months at which time she'll need a hemoglobin A1c.

## 2010-12-27 IMAGING — CT CT HEAD W/O CM
1 series · 16 of 30 positions shown, 20 images · non-contrast
Comparison: None.

CLINICAL DATA: Altered mental status.

CT HEAD WITHOUT CONTRAST
TECHNIQUE: Contiguous axial images were obtained from the base of
the skull through the vertex without contrast.

[Series 2: head routine 4.8 h37s · axial · 0.43mm/px · z∈[-172,-44]mm · 16 of 30 slices shown, 20 images]
[im 2/30  brain]
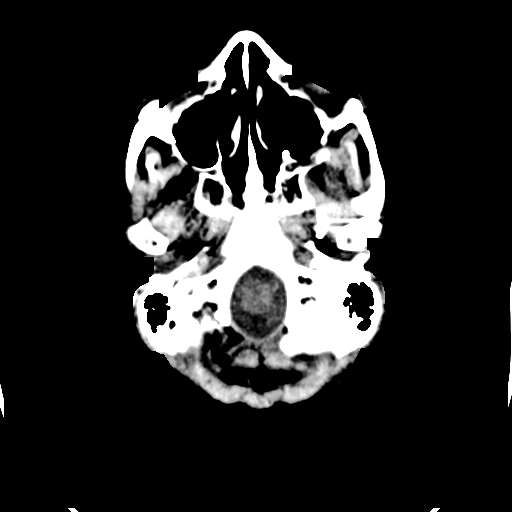
[im 2/30  bone]
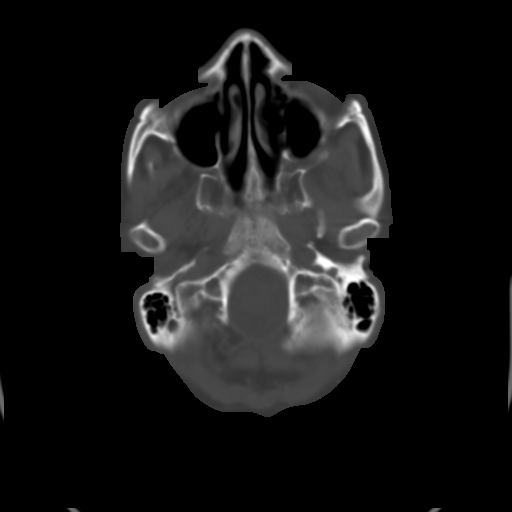
[im 4/30  brain]
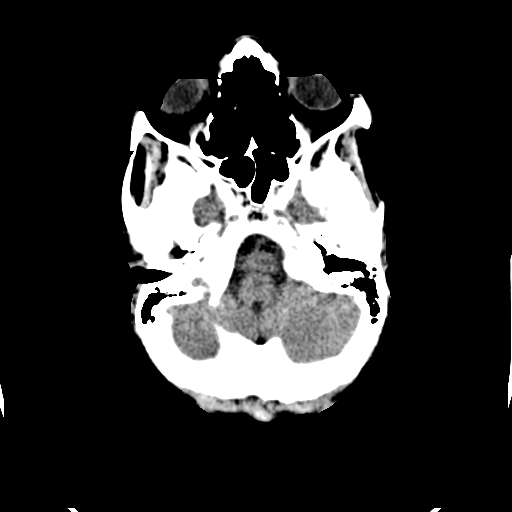
[im 6/30  brain]
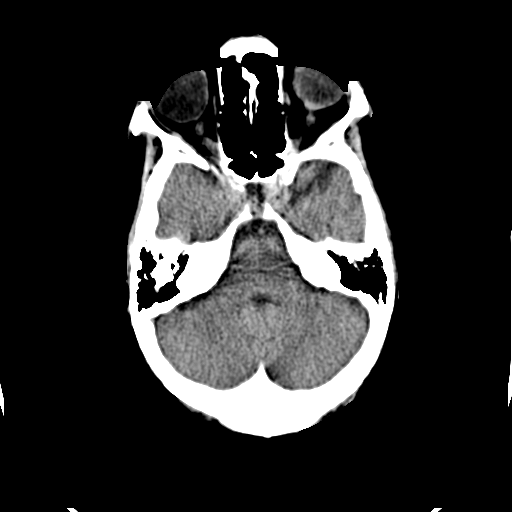
[im 8/30  brain]
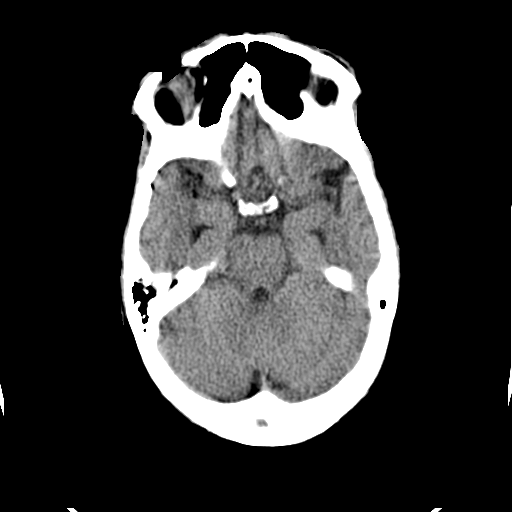
[im 9/30  brain]
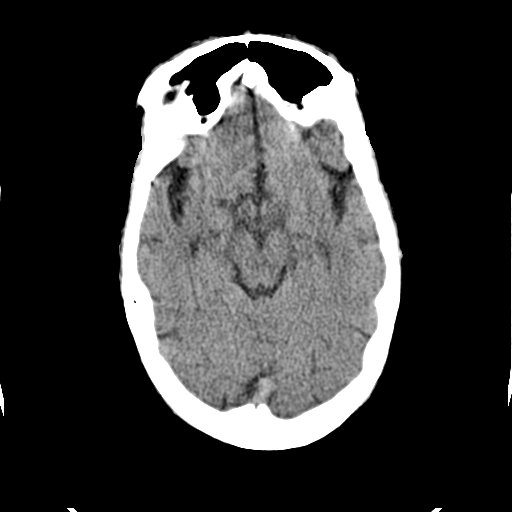
[im 9/30  bone]
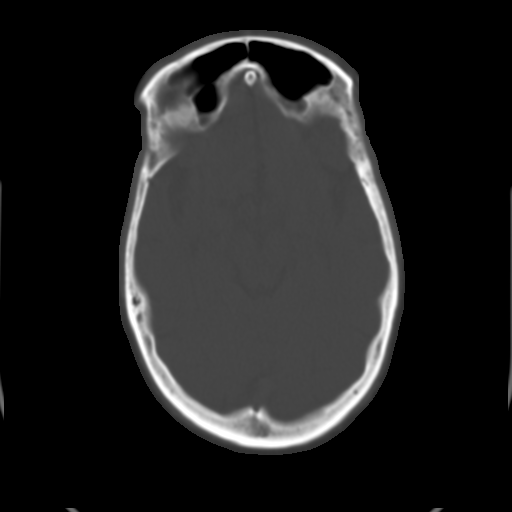
[im 11/30  brain]
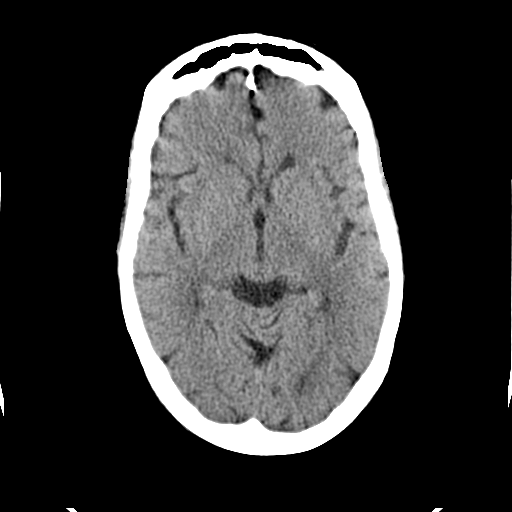
[im 13/30  brain]
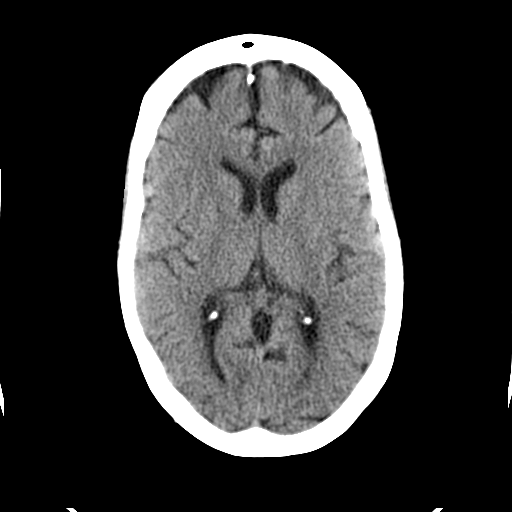
[im 15/30  brain]
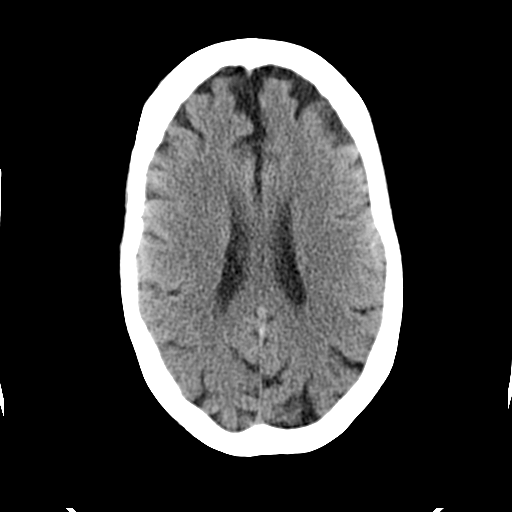
[im 16/30  brain]
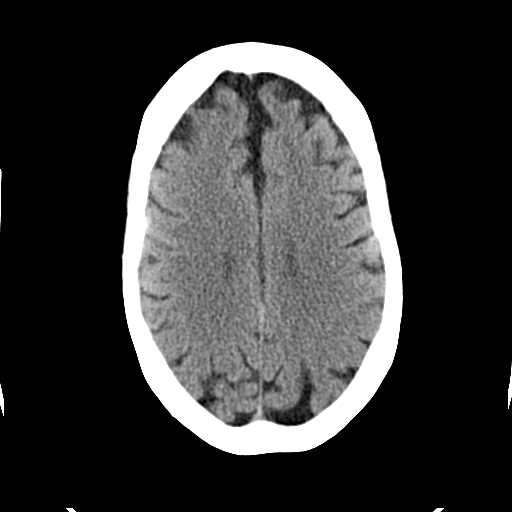
[im 16/30  bone]
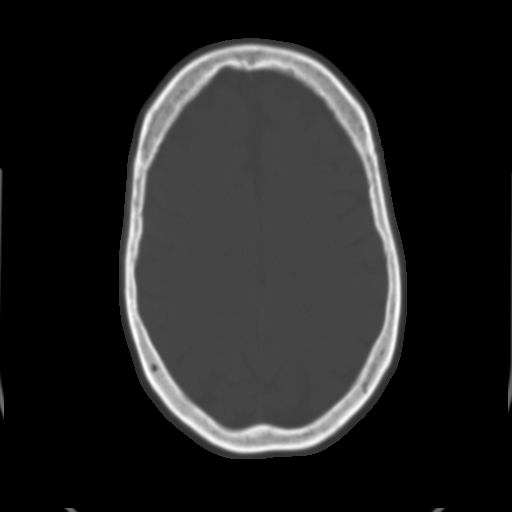
[im 18/30  brain]
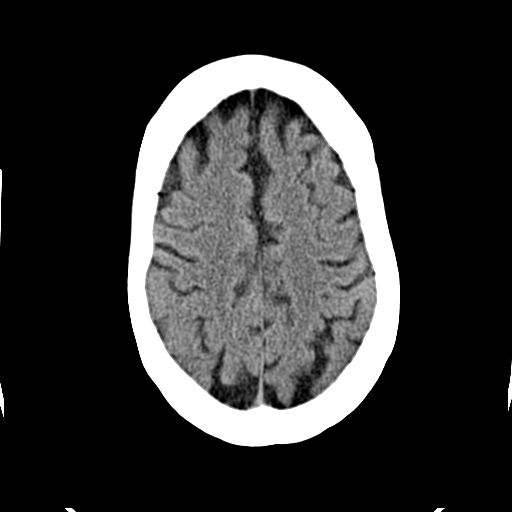
[im 20/30  brain]
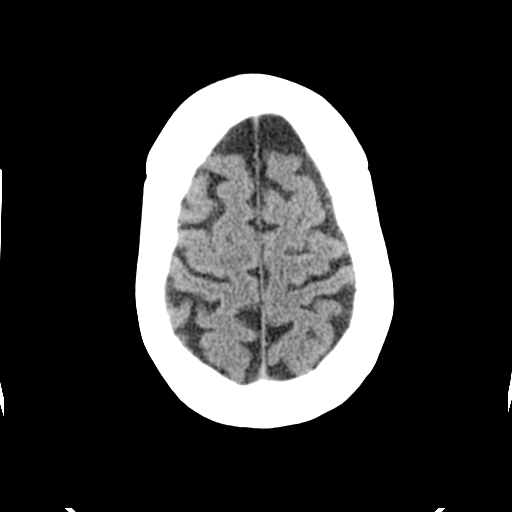
[im 22/30  brain]
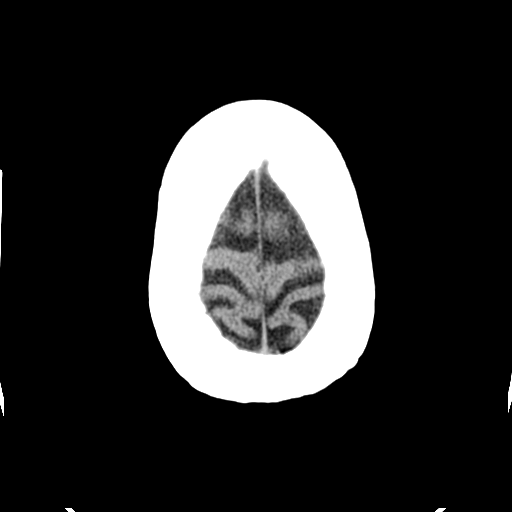
[im 23/30  brain]
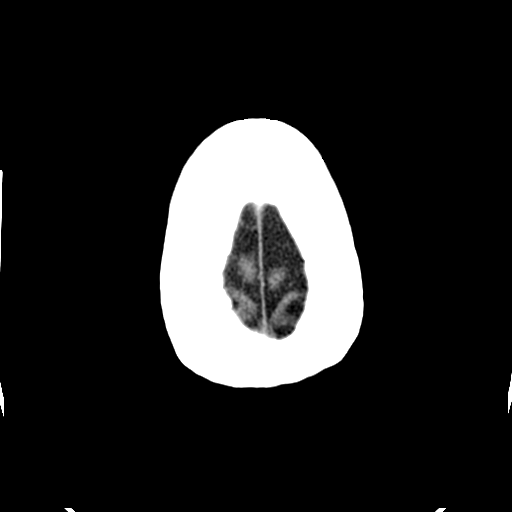
[im 23/30  bone]
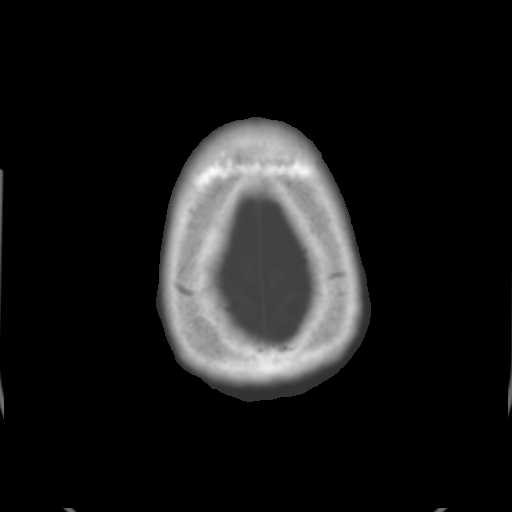
[im 25/30  brain]
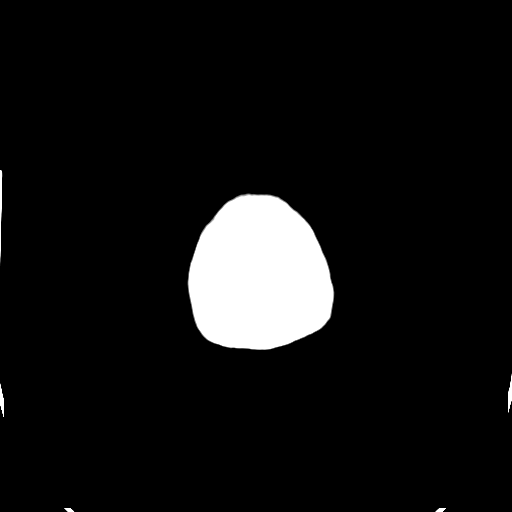
[im 27/30  brain]
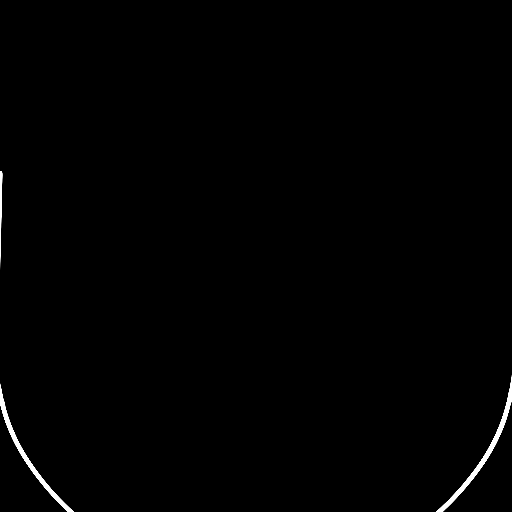
[im 29/30  brain]
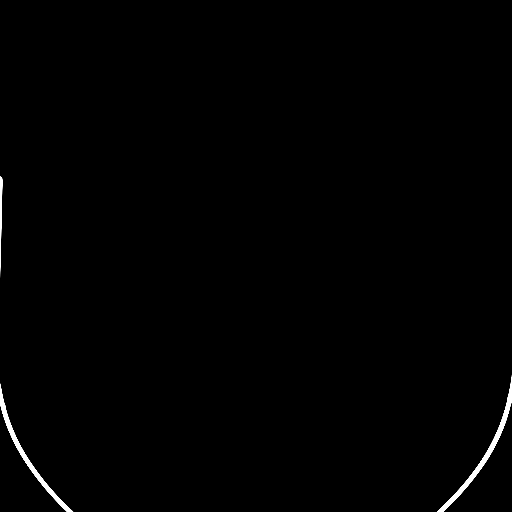

[16 of 30 positions shown; findings below may reference images not displayed]

FINDINGS: Mildly enlarged subarachnoid spaces.  Normal size and
position of the ventricles.  No intracranial hemorrhage, mass or CT
evidence of acute infarction.  Unremarkable bones and included
paranasal sinuses.
IMPRESSION: Mild diffuse cortical atrophy.  No acute abnormality.

## 2010-12-28 IMAGING — US US ABDOMEN COMPLETE
1 series · 13 of 25 positions shown · non-contrast
Comparison: None available.

CLINICAL DATA: New diagnosis of diabetes.  Rule out gallstones.
Pancreatitis.

ABDOMEN ULTRASOUND
TECHNIQUE: Complete abdominal ultrasound examination was performed
including evaluation of the liver, gallbladder, bile ducts,
pancreas, kidneys, spleen, IVC, and abdominal aorta.

[Series 1: us abdomen complete · 0.32mm/px · 13 of 47 slices shown]
[im 1/47]
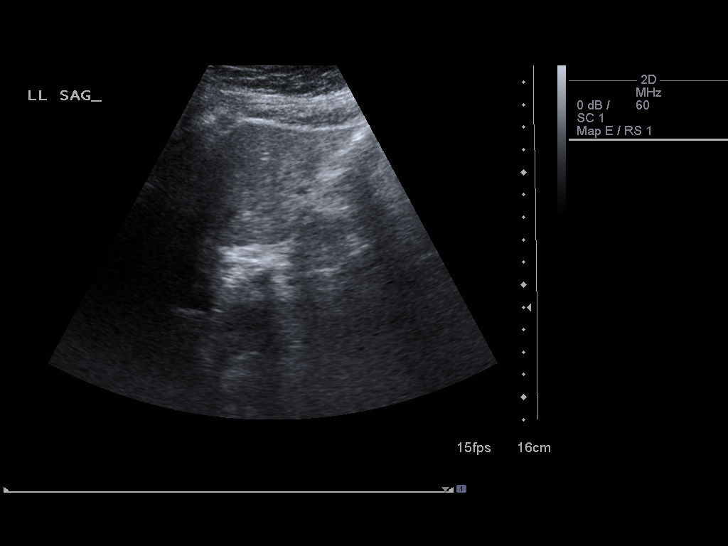
[im 4/47]
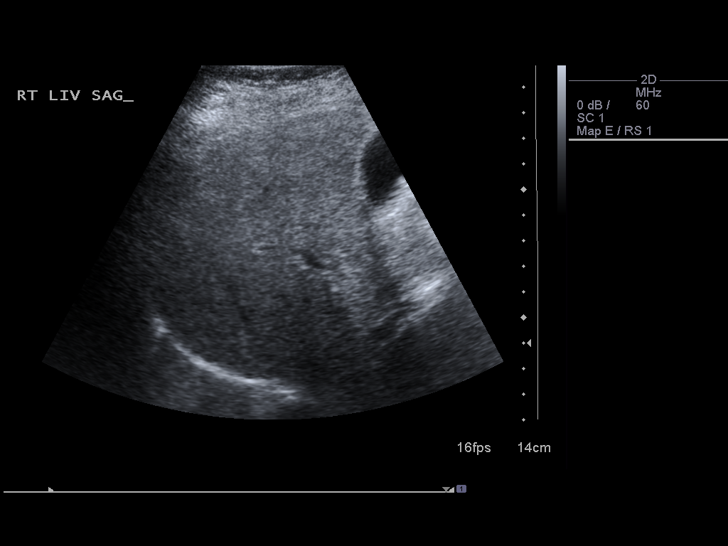
[im 8/47]
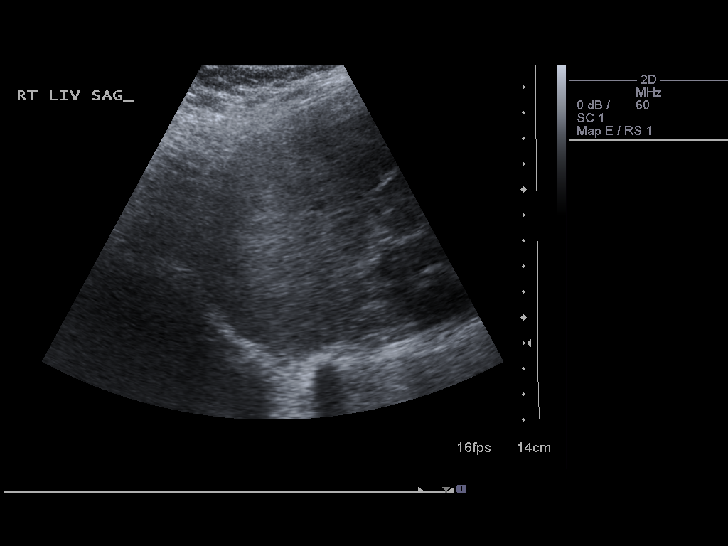
[im 12/47]
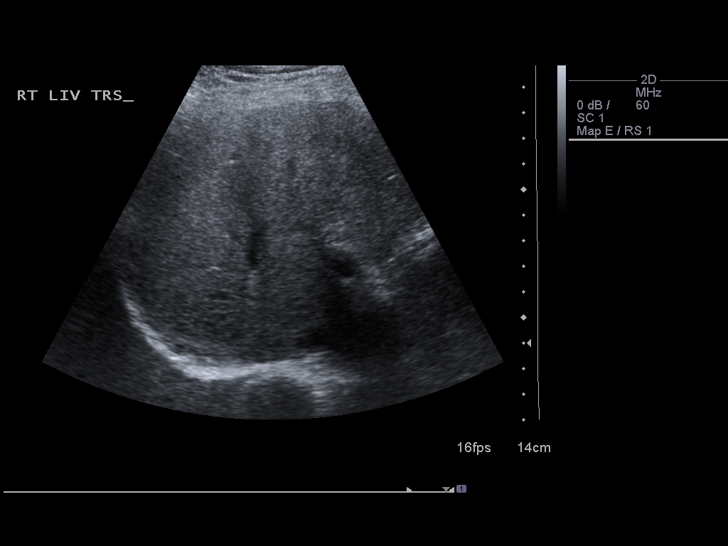
[im 16/47]
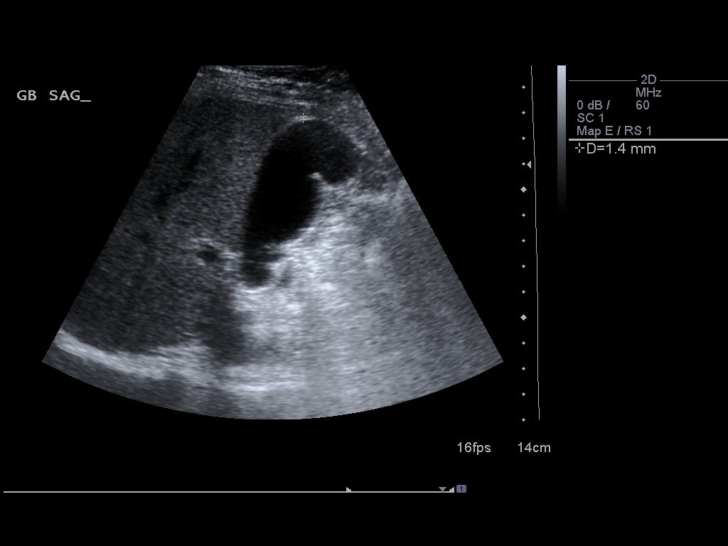
[im 20/47]
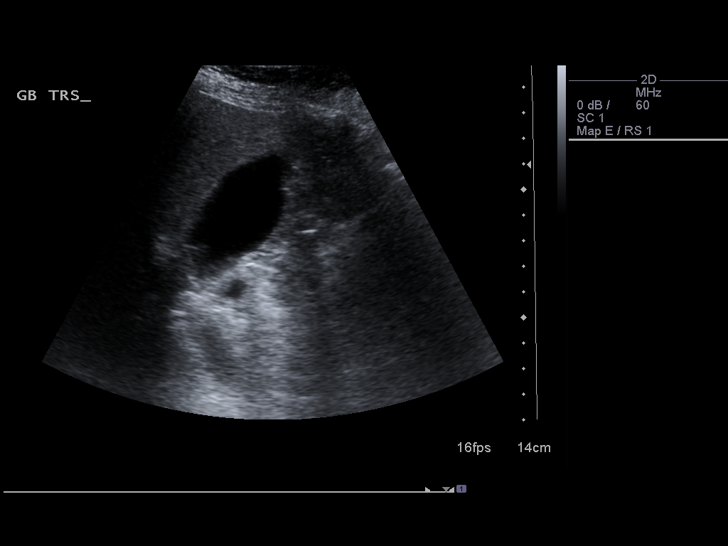
[im 24/47]
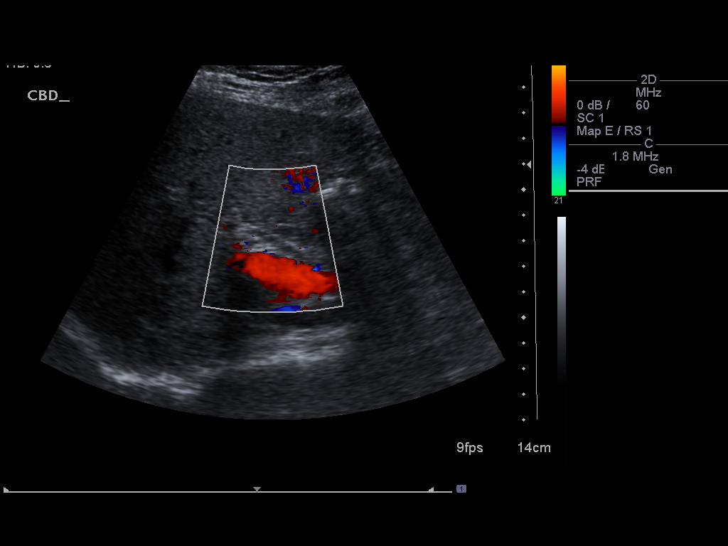
[im 27/47]
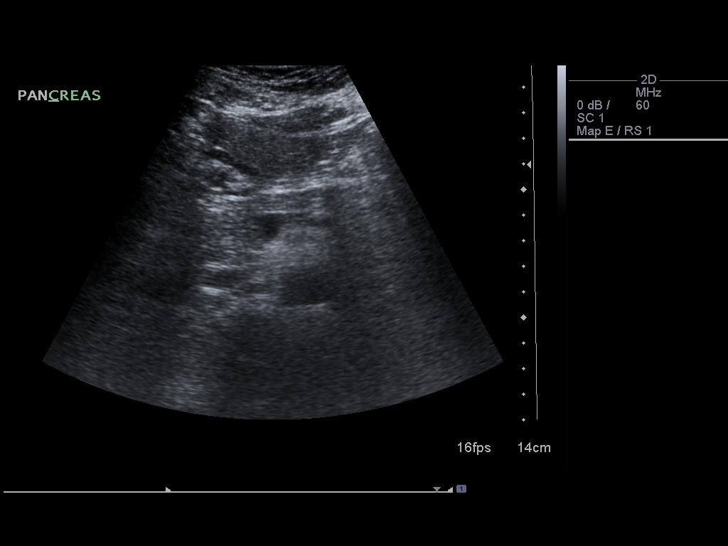
[im 31/47]
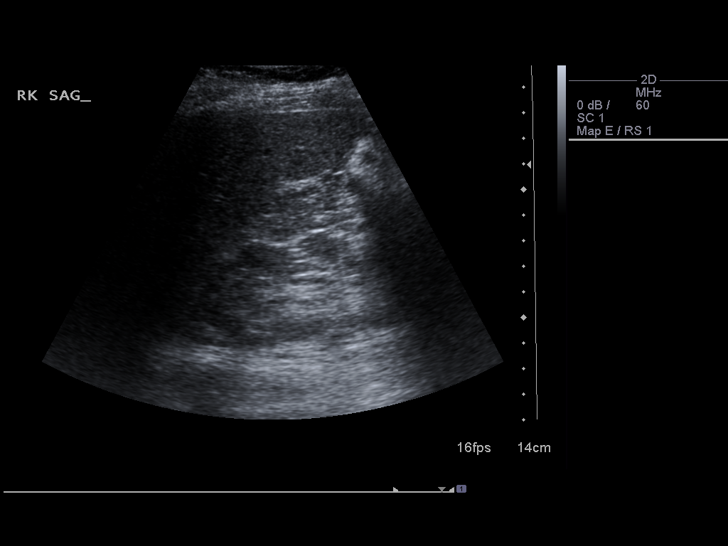
[im 35/47]
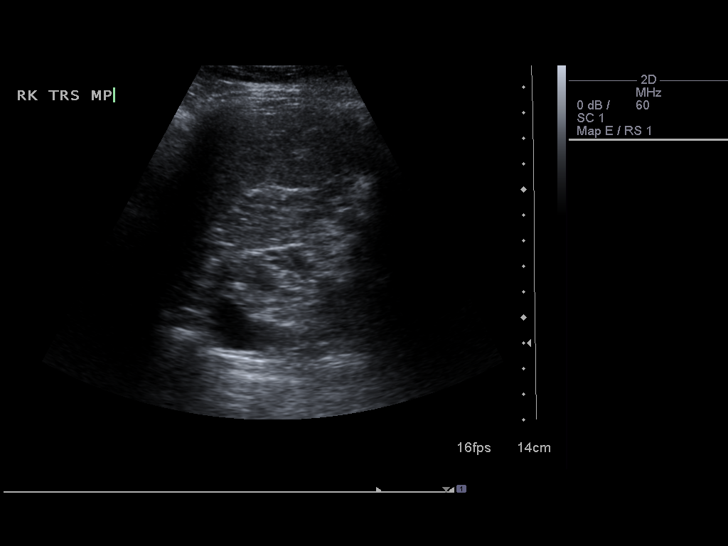
[im 39/47]
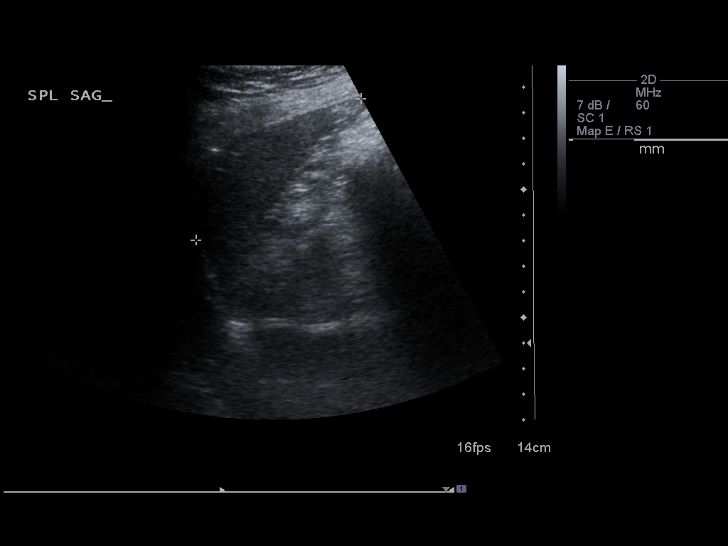
[im 43/47]
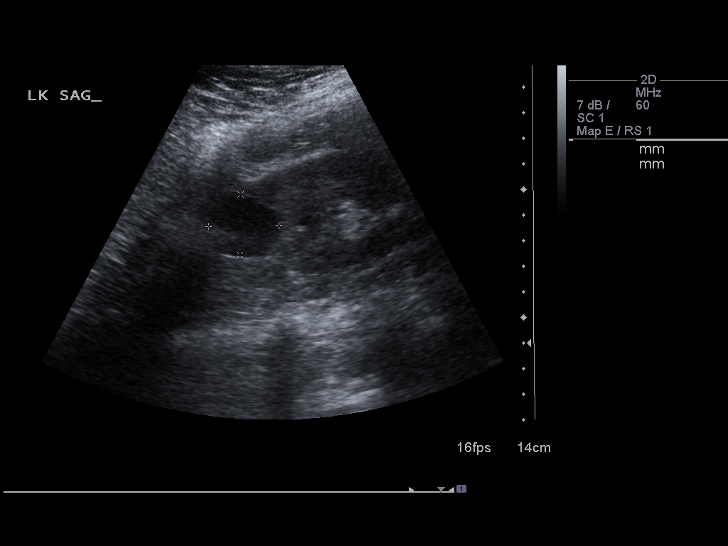
[im 47/47]
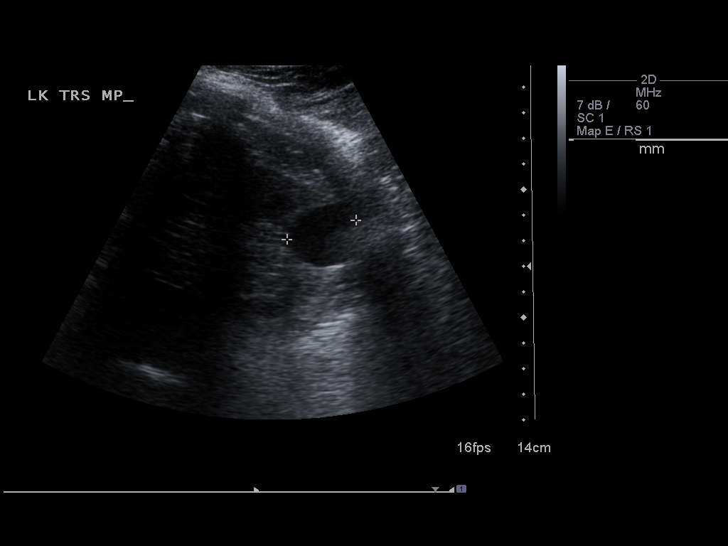

[13 of 25 positions shown; findings below may reference images not displayed]

FINDINGS: There is loss of normal internal architecture of the
liver with diffuse fatty infiltration.  No focal lesions are seen.
The gallbladder is within normal limits.  There is no sonographic
Murphy's sign.  The maximal wall thickness is 1.4 mm.  Common bile
duct is within normal limits at 3.3 mm.  The IVC is patent.

The pancreas is incompletely visualized.  No focal lesions are
seen.  Spleen is within normal limits for size and echotexture.  It
measures 8.5 cm maximally.  The abdominal aorta tapers from 2.1 cm.

The kidneys are small and hyperechoic bilaterally, measuring 8.7 cm
on the right and 8.8 cm on the left.  There are multiple cysts in
both kidneys.  The largest in the right kidney is of the interpolar
region measuring 2.0 x 1.8 x 1.6 cm.  The largest in the left
kidney is at the upper pole measuring 2.8 x 2.4 x 2.0 cm.
IMPRESSION: 1.  Small echogenic kidneys bilaterally.  This is nonspecific, but
to a can be seen in the setting of medical renal disease.
2.  Multiple renal cysts bilaterally.  The cyst at the upper pole
of the left kidney is not clearly simple.  MRI would be use for
further evaluation if clinically indicated.
3.  Diffuse fatty infiltration of the liver.
4.  No evidence for acute gallbladder disease.

## 2010-12-29 IMAGING — CR DG CHEST 1V PORT
1 series · 1 of 1 positions shown · non-contrast
Comparison: Portable chest x-ray of 03/24/2008

CLINICAL DATA: Altered mental status, diabetes

PORTABLE CHEST - 1 VIEW

[view not recorded]
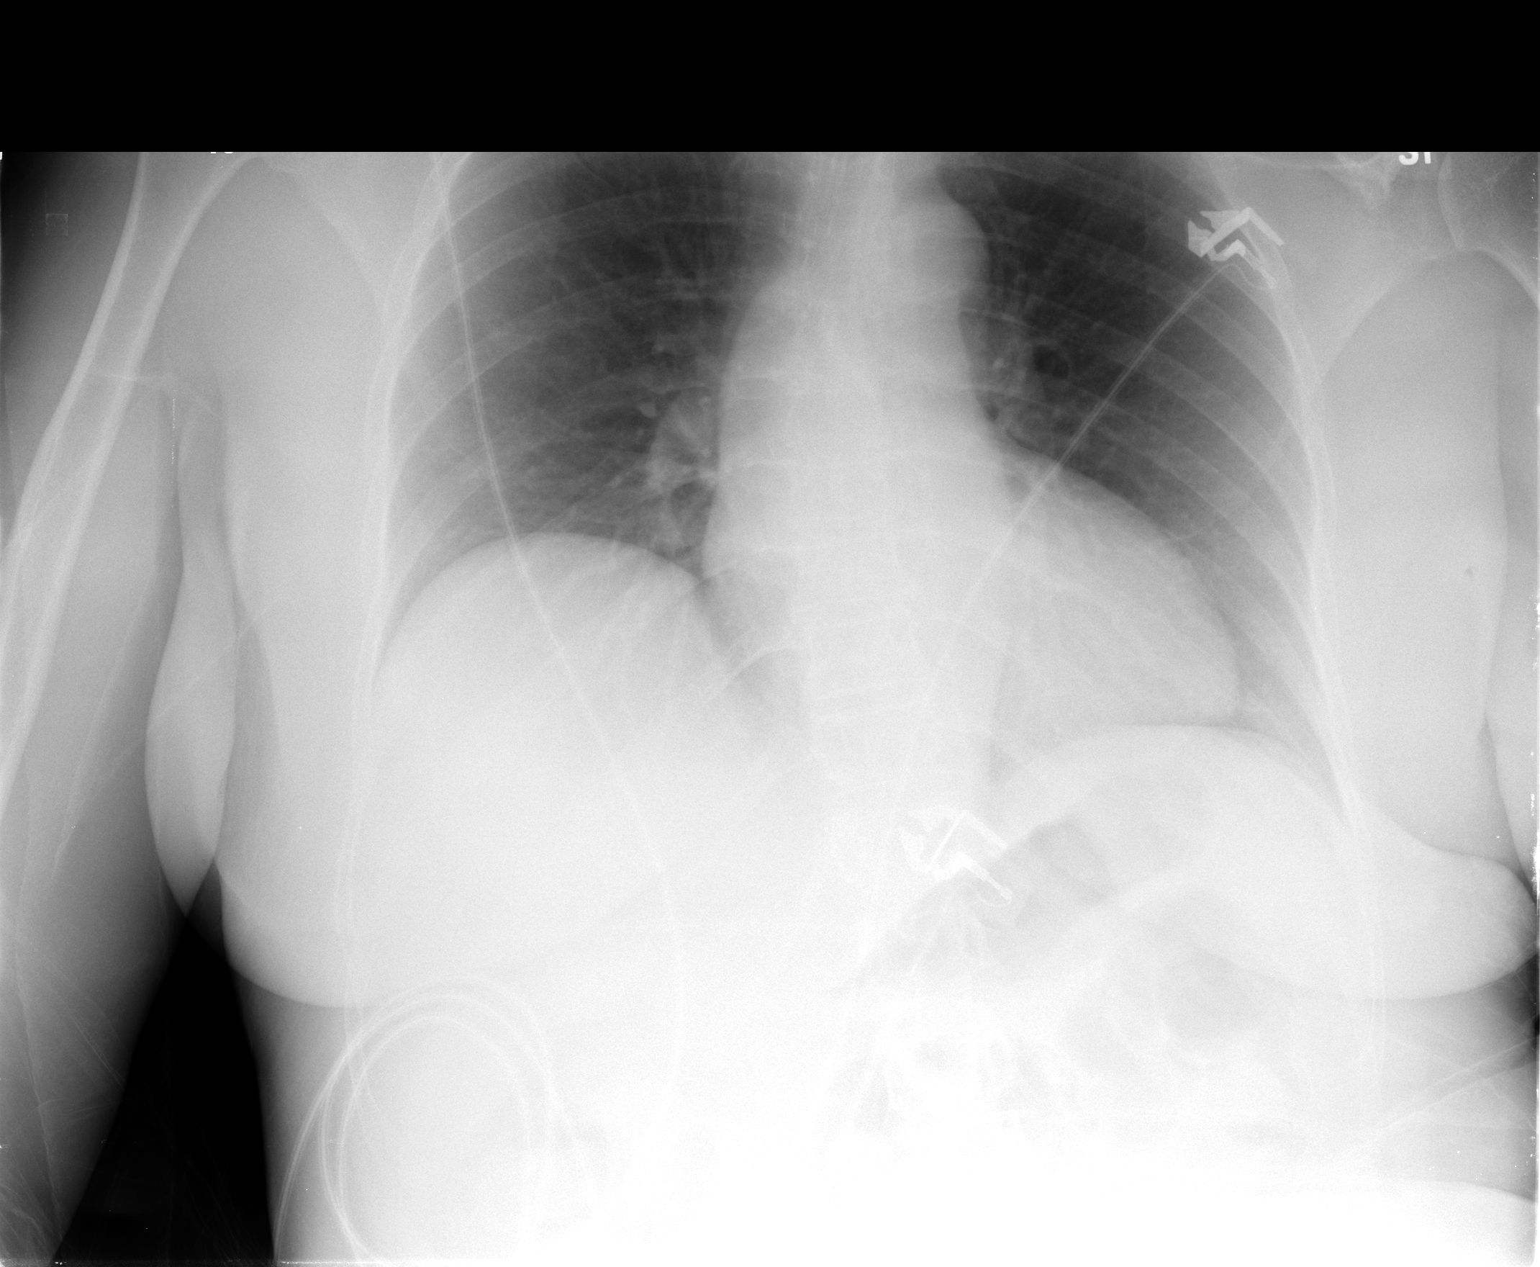

[1 of 1 positions shown; findings below may reference images not displayed]

FINDINGS: The lungs are clear. The heart is within upper limits of
normal.  There is mild curvature of the thoracic spine convex to
the right.  No acute bony abnormality is seen.
IMPRESSION: No active lung disease.  Mild cardiomegaly.

## 2011-04-08 ENCOUNTER — Other Ambulatory Visit: Payer: Medicare Other | Admitting: Internal Medicine

## 2011-04-08 DIAGNOSIS — E785 Hyperlipidemia, unspecified: Secondary | ICD-10-CM

## 2011-04-08 DIAGNOSIS — E039 Hypothyroidism, unspecified: Secondary | ICD-10-CM

## 2011-04-08 DIAGNOSIS — E119 Type 2 diabetes mellitus without complications: Secondary | ICD-10-CM | POA: Diagnosis not present

## 2011-04-08 DIAGNOSIS — E559 Vitamin D deficiency, unspecified: Secondary | ICD-10-CM

## 2011-04-08 DIAGNOSIS — I10 Essential (primary) hypertension: Secondary | ICD-10-CM | POA: Diagnosis not present

## 2011-04-08 LAB — CBC WITH DIFFERENTIAL/PLATELET
Basophils Absolute: 0 10*3/uL (ref 0.0–0.1)
Basophils Relative: 0 % (ref 0–1)
HCT: 39.1 % (ref 36.0–46.0)
MCHC: 30.9 g/dL (ref 30.0–36.0)
Monocytes Absolute: 0.5 10*3/uL (ref 0.1–1.0)
Neutro Abs: 4.2 10*3/uL (ref 1.7–7.7)
Neutrophils Relative %: 61 % (ref 43–77)
Platelets: 439 10*3/uL — ABNORMAL HIGH (ref 150–400)
RDW: 16.7 % — ABNORMAL HIGH (ref 11.5–15.5)

## 2011-04-08 LAB — COMPREHENSIVE METABOLIC PANEL
AST: 13 U/L (ref 0–37)
Albumin: 4.6 g/dL (ref 3.5–5.2)
Alkaline Phosphatase: 87 U/L (ref 39–117)
BUN: 27 mg/dL — ABNORMAL HIGH (ref 6–23)
Potassium: 4.7 mEq/L (ref 3.5–5.3)

## 2011-04-08 LAB — LIPID PANEL
HDL: 49 mg/dL (ref >39)
LDL Cholesterol: 160 mg/dL — ABNORMAL HIGH (ref 0–99)
Triglycerides: 118 mg/dL (ref <150)
VLDL: 24 mg/dL (ref 0–40)

## 2011-04-09 ENCOUNTER — Ambulatory Visit (INDEPENDENT_AMBULATORY_CARE_PROVIDER_SITE_OTHER): Payer: Medicare Other | Admitting: Internal Medicine

## 2011-04-09 ENCOUNTER — Encounter: Payer: Self-pay | Admitting: Internal Medicine

## 2011-04-09 DIAGNOSIS — F419 Anxiety disorder, unspecified: Secondary | ICD-10-CM

## 2011-04-09 DIAGNOSIS — I10 Essential (primary) hypertension: Secondary | ICD-10-CM

## 2011-04-09 DIAGNOSIS — F411 Generalized anxiety disorder: Secondary | ICD-10-CM | POA: Diagnosis not present

## 2011-04-09 DIAGNOSIS — E785 Hyperlipidemia, unspecified: Secondary | ICD-10-CM

## 2011-04-09 DIAGNOSIS — E119 Type 2 diabetes mellitus without complications: Secondary | ICD-10-CM

## 2011-04-09 DIAGNOSIS — N189 Chronic kidney disease, unspecified: Secondary | ICD-10-CM

## 2011-04-09 LAB — POCT URINALYSIS DIPSTICK
Bilirubin, UA: NEGATIVE
Glucose, UA: NEGATIVE
Ketones, UA: NEGATIVE
Spec Grav, UA: 1.02

## 2011-04-11 DIAGNOSIS — E785 Hyperlipidemia, unspecified: Secondary | ICD-10-CM | POA: Insufficient documentation

## 2011-04-11 NOTE — Patient Instructions (Addendum)
Continue same medications except change Zocor to Lipitor. Keep up the good work controlling  diabetes. May get shingles vaccine at drugstore or health Department. Return in 6 months for physical exam.

## 2011-04-11 NOTE — Progress Notes (Signed)
  Subjective:    Patient ID: Erin [name], female    DOB: February 16, 1945, 66 y.o.   MRN: [id number]  HPI 66 year old black female with history of chronic kidney disease, hypertension, diabetes mellitus, hyperlipidemia for six-month recheck. She's currently on Lantus insulin 40 units at bedtime and takes NovoLog 4 units before breakfast lunch and dinner. Her hypertension is on Norvasc 10 mg daily and HCTZ 25 mg daily. For anxiety takes Xanax 0.5 mg one half to one by mouth twice daily. Finances are a problem. Some of her medications she obtains through an assistance program with the health department. Recent lipid panel shows total cholesterol of 233, triglycerides 118, LDL cholesterol 160, HDL cholesterol 49. Creatinine is stable at 1.80 but has been as high as 4.90 in 2010 when she was admitted with dehydration and diabetes out of control. Hemoglobin A1c 7.4% stable. When she was admitted with diabetes and dehydration 3 years ago it was 14.3%. She's also on Benicar 40 mg daily. Blood pressure was initially difficult to control.    Review of Systems     Objective:   Physical Exam neck supple without thyromegaly JVD or carotid bruits; chest clear; cardiac exam regular rate and rhythm normal S1 and S2; extremities without edema        Assessment & Plan:  Chronic kidney disease  Diabetes mellitus-insulin-dependent  Hypertension  Hyperlipidemia  Anxiety  Plan: Refill Xanax 0.5 mg #60 with 3 refills. Continue same medications for now. Watch creatinine every 6 months. For now leave on Benicar. Change Zocor to Lipitor.

## 2011-08-27 ENCOUNTER — Other Ambulatory Visit: Payer: Self-pay

## 2011-08-27 MED ORDER — ATORVASTATIN CALCIUM 10 MG PO TABS: 10.0000 mg | ORAL_TABLET | Freq: Every day | ORAL | Status: DC

## 2011-10-15 ENCOUNTER — Ambulatory Visit (INDEPENDENT_AMBULATORY_CARE_PROVIDER_SITE_OTHER): Payer: Medicare Other | Admitting: Internal Medicine

## 2011-10-15 ENCOUNTER — Encounter: Payer: Self-pay | Admitting: Internal Medicine

## 2011-10-15 VITALS — BP 150/80 | HR 80 | Temp 98.9°F | Ht 61.0 in | Wt 160.0 lb

## 2011-10-15 DIAGNOSIS — E785 Hyperlipidemia, unspecified: Secondary | ICD-10-CM | POA: Diagnosis not present

## 2011-10-15 DIAGNOSIS — N189 Chronic kidney disease, unspecified: Secondary | ICD-10-CM | POA: Diagnosis not present

## 2011-10-15 DIAGNOSIS — I10 Essential (primary) hypertension: Secondary | ICD-10-CM | POA: Diagnosis not present

## 2011-10-15 DIAGNOSIS — Z23 Encounter for immunization: Secondary | ICD-10-CM | POA: Diagnosis not present

## 2011-10-15 DIAGNOSIS — E119 Type 2 diabetes mellitus without complications: Secondary | ICD-10-CM

## 2011-10-15 LAB — HEMOGLOBIN A1C
Hgb A1c MFr Bld: 7.2 % — ABNORMAL HIGH (ref <5.7)
Mean Plasma Glucose: 160 mg/dL — ABNORMAL HIGH (ref <117)

## 2011-10-15 NOTE — Patient Instructions (Addendum)
Continue same meds and return in 6 months for PE .  Call me with BP readings next week.

## 2011-10-15 NOTE — Progress Notes (Addendum)
  Subjective:    Patient ID: Erin [name], female    DOB: 1945/02/14, 66 y.o.   MRN: [id number]  HPI 37 year Black female  with hypertension, hyperlipemia, insulin-dependent diabetes, chronic kidney disease for 6 month recheck.  BP elevated today because of anxiety. Accuchecks running 99-124. Diabetic eye exam done about a year ago. Anxious because she is in doctor's office. Agrees to take influenza immunization. Compliant with medications. 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 Does not work outside the home. Finances are limited. Is able to get medications through an assistance program with health Department. Has a daughter, Varvara Legault, who is single, never married who helps get her medications for her.  Review of Systems     Objective:   Physical Exam Neck is supple without JVD, thyromegaly, or carotid bruits. Skin is warm and dry without rashes. Chest clear to auscultation. Cardiac exam regular rate and rhythm normal S1 and S2. Extremities without pitting edema. Alert and oriented x3 . Blood pressure rechecked 150/80.            Assessment & Plan:  Hypertension-blood pressure elevated today because of anxiety. Did not take medications before coming to the office.  Hyperlipidemia  Insulin-dependent diabetes mellitus-controlled  History of chronic kidney disease  Anxiety  Plan: Patient's blood pressure elevated today because of anxiety. Patient will have blood pressure checked at home and call me with some readings next week. No change in medications at this point in time. Hemoglobin A1c drawn and is pending. Return in 6 months for physical examination.

## 2011-10-29 ENCOUNTER — Other Ambulatory Visit: Payer: Self-pay

## 2011-10-29 MED ORDER — INSULIN ASPART 100 UNIT/ML ~~LOC~~ SOLN: 4.0000 [IU] | Freq: Three times a day (TID) | SUBCUTANEOUS | Status: DC

## 2011-10-31 ENCOUNTER — Encounter: Payer: Self-pay | Admitting: Internal Medicine

## 2011-11-11 ENCOUNTER — Other Ambulatory Visit: Payer: Self-pay

## 2011-11-11 MED ORDER — INSULIN GLARGINE 100 UNIT/ML ~~LOC~~ SOLN: 40.0000 [IU] | Freq: Every day | SUBCUTANEOUS | Status: DC

## 2012-03-02 ENCOUNTER — Other Ambulatory Visit: Payer: Self-pay

## 2012-03-02 MED ORDER — ATORVASTATIN CALCIUM 10 MG PO TABS: 10.0000 mg | ORAL_TABLET | Freq: Every day | ORAL | Status: DC

## 2012-04-11 ENCOUNTER — Other Ambulatory Visit: Payer: Medicare Other | Admitting: Internal Medicine

## 2012-04-11 DIAGNOSIS — I10 Essential (primary) hypertension: Secondary | ICD-10-CM

## 2012-04-11 DIAGNOSIS — Z79899 Other long term (current) drug therapy: Secondary | ICD-10-CM | POA: Diagnosis not present

## 2012-04-11 DIAGNOSIS — E785 Hyperlipidemia, unspecified: Secondary | ICD-10-CM | POA: Diagnosis not present

## 2012-04-11 DIAGNOSIS — Z0000 Encounter for general adult medical examination without abnormal findings: Secondary | ICD-10-CM | POA: Diagnosis not present

## 2012-04-11 DIAGNOSIS — E109 Type 1 diabetes mellitus without complications: Secondary | ICD-10-CM

## 2012-04-11 DIAGNOSIS — Z130 Encounter for screening for diseases of the blood and blood-forming organs and certain disorders involving the immune mechanism: Secondary | ICD-10-CM

## 2012-04-11 LAB — CBC WITH DIFFERENTIAL/PLATELET
Hemoglobin: 13.3 g/dL (ref 12.0–15.0)
Lymphs Abs: 2.3 10*3/uL (ref 0.7–4.0)
Monocytes Relative: 8 % (ref 3–12)
Neutro Abs: 4.3 10*3/uL (ref 1.7–7.7)
Neutrophils Relative %: 60 % (ref 43–77)
RBC: 5.25 MIL/uL — ABNORMAL HIGH (ref 3.87–5.11)
WBC: 7.2 10*3/uL (ref 4.0–10.5)

## 2012-04-11 LAB — COMPREHENSIVE METABOLIC PANEL
ALT: 16 U/L (ref 0–35)
Albumin: 5.1 g/dL (ref 3.5–5.2)
Alkaline Phosphatase: 85 U/L (ref 39–117)
CO2: 26 mEq/L (ref 19–32)
Glucose, Bld: 169 mg/dL — ABNORMAL HIGH (ref 70–99)
Potassium: 4.3 mEq/L (ref 3.5–5.3)
Sodium: 137 mEq/L (ref 135–145)
Total Protein: 7.4 g/dL (ref 6.0–8.3)

## 2012-04-11 LAB — LIPID PANEL
Cholesterol: 246 mg/dL — ABNORMAL HIGH (ref 0–200)
LDL Cholesterol: 163 mg/dL — ABNORMAL HIGH (ref 0–99)
Triglycerides: 199 mg/dL — ABNORMAL HIGH (ref <150)

## 2012-04-12 LAB — HEMOGLOBIN A1C
Hgb A1c MFr Bld: 9.1 % — ABNORMAL HIGH (ref <5.7)
Mean Plasma Glucose: 214 mg/dL — ABNORMAL HIGH (ref <117)

## 2012-04-13 ENCOUNTER — Encounter: Payer: Self-pay | Admitting: Internal Medicine

## 2012-04-13 ENCOUNTER — Ambulatory Visit: Payer: Medicare Other | Admitting: Internal Medicine

## 2012-04-13 ENCOUNTER — Other Ambulatory Visit (HOSPITAL_COMMUNITY)
Admission: RE | Admit: 2012-04-13 | Discharge: 2012-04-13 | Disposition: A | Discharge status: Home / Self Care | Payer: Medicare Other | Source: Ambulatory Visit | Attending: Internal Medicine | Admitting: Internal Medicine

## 2012-04-13 VITALS — BP 146/66 | HR 100 | Ht 61.5 in | Wt 160.0 lb

## 2012-04-13 DIAGNOSIS — F411 Generalized anxiety disorder: Secondary | ICD-10-CM

## 2012-04-13 DIAGNOSIS — I10 Essential (primary) hypertension: Secondary | ICD-10-CM | POA: Diagnosis not present

## 2012-04-13 DIAGNOSIS — Z124 Encounter for screening for malignant neoplasm of cervix: Secondary | ICD-10-CM | POA: Insufficient documentation

## 2012-04-13 DIAGNOSIS — E785 Hyperlipidemia, unspecified: Secondary | ICD-10-CM

## 2012-04-13 DIAGNOSIS — Z0000 Encounter for general adult medical examination without abnormal findings: Secondary | ICD-10-CM

## 2012-04-13 DIAGNOSIS — N189 Chronic kidney disease, unspecified: Secondary | ICD-10-CM

## 2012-04-13 DIAGNOSIS — E119 Type 2 diabetes mellitus without complications: Secondary | ICD-10-CM

## 2012-04-13 DIAGNOSIS — E8881 Metabolic syndrome: Secondary | ICD-10-CM

## 2012-04-13 LAB — POCT URINALYSIS DIPSTICK
Nitrite, UA: NEGATIVE
Protein, UA: NEGATIVE
Urobilinogen, UA: NEGATIVE

## 2012-04-13 NOTE — Progress Notes (Signed)
Subjective:    Patient ID: Erin [name], female    DOB: [birth year], 67 y.o.   MRN: [id number]  HPI 24 year olf Black female for health maintenance and evaluation of medical problems. History of insulin-dependent diabetes mellitus, hypertension, hyperlipidemia, chronic kidney disease and anxiety. Hemoglobin A1c has increased since last reading. Says that several family members have passed away including brother-in-law. Patient is a widow. Anniversary of husband's death will be in 2022-06-04. She cries talking about this. She misses him terribly. She stays busy keeping grandchildren. Recently they've been out of school a lot and she's had to keep him. They are rambunctious. She gets anxious about that. Hasn't been following a strict diet as she should. She takes Lantus insulin and NovoLog. However, has recently run out of Lipitor so lipids are elevated. She gets medications for health department. I have given her samples of Lantus today. Doesn't sleep well at night. Have prescribed Xanax 0.5 mg to take twice a day as needed for anxiety particularly at bedtime for sleep.    Patient was admitted to the hospital in 07/03/2008 with renal failure. Was found to be in diabetic ketoacidosis with hyperosmolar state. Blood sugar was 1212. She was dehydrated. Creatinine was 4.90.   Hemoglobin A1c 9.1% and previously was 7.2% Fall 2013. This is disconcerting. TSH is within normal limits. Patient says morning Accu-Cheks are running around 140.  Dr. Mitzi Davenport does eye exams. Reminded about annual diabetic eye exam.  Family history: Father died in his 46s of diabetes. One sister  and brother who are healthy. Mother living with history of breast cancer and diabetes.  Social history: She has 3 children. She her husband were married for 25 years and he died of a massive MI in 03-Jul-2004. She was born in [place] and grew up in [place]. 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 Does not smoke or consume alcohol.    Review of Systems  Constitutional: Negative.   HENT: Negative.   Eyes: Negative.   Respiratory: Negative.   Cardiovascular:       Denies chest pain  Endocrine: Negative.   Genitourinary: Negative.   Allergic/Immunologic: Negative.   Neurological: Negative.   Psychiatric/Behavioral:       Grief reaction over anniversary of husband's death in 2004-07-03  Anxiety       Objective:   Physical Exam  Vitals reviewed. Constitutional: She is oriented to person, place, and time. She appears well-developed and well-nourished. No distress.  HENT:  Head: Normocephalic and atraumatic.  Left Ear: External ear normal.  Mouth/Throat: Oropharynx is clear and moist.  Eyes: Conjunctivae and EOM are normal. Pupils are equal, round, and reactive to light. Right eye exhibits no discharge. Left eye exhibits no discharge.  Neck: Neck supple. No JVD present. No thyromegaly present.  Cardiovascular: Normal rate, regular rhythm, normal heart sounds and intact distal pulses.   No murmur heard. Pulmonary/Chest: Effort normal and breath sounds normal. She has no wheezes. She has no rales.  Abdominal: Soft. Bowel sounds are normal. She exhibits no distension and no mass. There is no tenderness. There is no rebound and no guarding.  Musculoskeletal: Normal range of motion. She exhibits no edema.  Lymphadenopathy:    She has no cervical adenopathy.  Neurological: She is oriented to person, place, and time. She has normal reflexes. No cranial nerve deficit. Coordination normal.  Skin: Skin is warm and dry. No rash noted.  She is not diaphoretic.  Diabetic foot exam no ulcers or calluses.  Psychiatric: She has a normal mood and affect. Her behavior is normal. Judgment and thought content normal.  Cries when speaks about husband's death          Assessment & Plan:  Chronic kidney disease-stable   Insulin-dependent diabetes  mellitus-hemoglobin A1c has increased recently. Patient needs to watch diet. We'll plan to see her again in May for followup.  Hypertension-stable  Hyperlipidemia-has run out of statin medication.  Anxiety  Insomnia  Plan have given her Xanax for insomnia and anxiety. Is return in May 2014 for followup.   Subjective:   Patient presents for Medicare Annual/Subsequent preventive examination.   Review Past Medical/Family/Social: See EPIC   Risk Factors  Current exercise habits: Sedentary Dietary issues discussed: Low-fat low-carb  Cardiac risk factors:    Depression Screen  (Note: if answer to either of the following is "Yes", a more complete depression screening is indicated)  Over the past two weeks, have you felt down, depressed or hopeless? Yes husband's death will be 8 years ago in 06-08-2022 Over the past two weeks, have you felt little interest or pleasure in doing things? no Have you lost interest or pleasure in daily life? no Do you often feel hopeless? no Do you cry easily over simple problems? No   Activities of Daily Living  In your present state of health, do you have any difficulty performing the following activities?:  Driving? No  Managing money? No  Feeding yourself? No  Getting from bed to chair? No  Climbing a flight of stairs? No  Preparing food and eating?: No  Bathing or showering? No  Getting dressed: No  Getting to the toilet? No  Using the toilet:No  Moving around from place to place: No  In the past year have you fallen or had a near fall?:No  Are you sexually active? No  Do you have more than one partner? No   Hearing Difficulties: No  Do you often ask people to speak up or repeat themselves? No  Do you experience ringing or noises in your ears? no Do you have difficulty understanding soft or whispered voices? No  Do you feel that you have a problem with memory?no Do you often misplace items? No  Do you feel safe at home? Yes   Cognitive  Testing  Alert? Yes Normal Appearance?Yes  Oriented to person? Yes Place? Yes  Time? Yes  Recall of three objects? Yes  Can perform simple calculations? Yes  Displays appropriate judgment?Yes  Can read the correct time from a watch face?Yes   List the Names of Other Physician/Practitioners you currently use: NONE   Indicate any recent Medical Services you may have received from other than [facility] providers in the past year (date may be approximate).   Screening Tests / Date Colonoscopy   - declined                  Zostavax orser written for pharmacy Mammogram  Needs annual mammogram Influenza Vaccine Sept 2013 Tetanus/tdap3/2/12 pnemovax 04/03/10   Objective:    Body mass index: see EPIC BP     Pulse     Ht       Wt       BMI      SpO2   see EPIC    General appearance: Appears stated age and mildly obese  Head: Normocephalic, without obvious abnormality, atraumatic  Eyes: conj clear, EOMi PEERLA  Ears: normal TM's and external ear canals both ears  Nose: Nares normal. Septum midline. Mucosa normal. No drainage or sinus tenderness.  Throat: lips, mucosa, and tongue normal; teeth and gums normal  Neck: no adenopathy, no carotid bruit, no JVD, supple, symmetrical, trachea midline and thyroid not enlarged, symmetric, no tenderness/mass/nodules  No CVA tenderness.  Lungs: clear to auscultation bilaterally  Breasts: normal appearance, no masses or tenderness. Heart: regular rate and rhythm, S1, S2 normal, no murmur, click, rub or gallop  Abdomen: soft, non-tender; bowel sounds normal; no masses, no organomegaly  Musculoskeletal: ROM normal in all joints, no crepitus, no deformity, Normal muscle strengthen. Back  is symmetric, no curvature. Skin: Skin color, texture, turgor normal. No rashes or lesions  Lymph nodes: Cervical, supraclavicular, and axillary nodes normal.  Neurologic: CN 2 -12 Normal, Normal symmetric reflexes. Normal coordination and gait  Psych: Alert &  Oriented x 3, Mood appear stable.    Assessment:    Annual wellness medicare exam   Plan:    During the course of the visit the patient was educated and counseled about appropriate screening and preventive services including:  Screening mammography  Colorectal cancer screening  Shingles vaccine counseling.  Diet review for nutrition referral? Yes ____ Not Indicated __x__  Patient Instructions (the written plan) was given to the patient.  Medicare Attestation  I have personally reviewed:  The patient's medical and social history  Their use of alcohol, tobacco or illicit drugs  Their current medications and supplements  The patient's functional ability including ADLs,fall risks, home safety risks, cognitive, and hearing and visual impairment  Diet and physical activities  Evidence for depression or mood disorders  The patient's weight, height, BMI, and visual acuity have been recorded in the chart. I have made referrals, counseling, and provided education to the patient based on review of the above and I have provided the patient with a written personalized care plan for preventive services.    !

## 2012-04-13 NOTE — Patient Instructions (Addendum)
Continue same medications and return in 3-4 months. Xanax has been refilled. Try to watch diet.

## 2012-04-15 ENCOUNTER — Encounter: Payer: Self-pay | Admitting: Internal Medicine

## 2012-04-15 DIAGNOSIS — E8881 Metabolic syndrome: Secondary | ICD-10-CM | POA: Insufficient documentation

## 2012-05-25 ENCOUNTER — Other Ambulatory Visit: Payer: Medicare Other | Admitting: Internal Medicine

## 2012-05-26 ENCOUNTER — Ambulatory Visit: Payer: Medicare Other | Admitting: Internal Medicine

## 2012-06-06 ENCOUNTER — Other Ambulatory Visit: Payer: Medicare Other | Admitting: Internal Medicine

## 2012-06-06 DIAGNOSIS — E119 Type 2 diabetes mellitus without complications: Secondary | ICD-10-CM

## 2012-06-06 DIAGNOSIS — Z79899 Other long term (current) drug therapy: Secondary | ICD-10-CM

## 2012-06-06 DIAGNOSIS — E785 Hyperlipidemia, unspecified: Secondary | ICD-10-CM | POA: Diagnosis not present

## 2012-06-06 LAB — LIPID PANEL: Cholesterol: 173 mg/dL (ref 0–200)

## 2012-06-06 LAB — HEPATIC FUNCTION PANEL
ALT: 17 U/L (ref 0–35)
AST: 13 U/L (ref 0–37)
Bilirubin, Direct: 0.1 mg/dL (ref 0.0–0.3)
Indirect Bilirubin: 0.6 mg/dL (ref 0.0–0.9)
Total Bilirubin: 0.7 mg/dL (ref 0.3–1.2)

## 2012-06-08 ENCOUNTER — Ambulatory Visit (INDEPENDENT_AMBULATORY_CARE_PROVIDER_SITE_OTHER): Payer: Medicare Other | Admitting: Internal Medicine

## 2012-06-08 ENCOUNTER — Encounter: Payer: Self-pay | Admitting: Internal Medicine

## 2012-06-08 VITALS — BP 150/78 | HR 96 | Wt 162.0 lb

## 2012-06-08 DIAGNOSIS — E119 Type 2 diabetes mellitus without complications: Secondary | ICD-10-CM | POA: Diagnosis not present

## 2012-06-08 DIAGNOSIS — N189 Chronic kidney disease, unspecified: Secondary | ICD-10-CM

## 2012-06-08 DIAGNOSIS — I10 Essential (primary) hypertension: Secondary | ICD-10-CM | POA: Diagnosis not present

## 2012-06-08 DIAGNOSIS — Z794 Long term (current) use of insulin: Secondary | ICD-10-CM

## 2012-06-08 NOTE — Progress Notes (Signed)
  Subjective:    Patient ID: Erin [name], female    DOB: 1945-04-30, 67 y.o.   MRN: [id number]  HPI  in today to followup on insulin-dependent diabetes. Last visit hemoglobin A1c was 9.1% and has decreased to 8.6%.  Not pleased with her glucose control at this point. She takes 4 units Humalog 3 times daily and Lantus 40 units at bedtime. Says Accu-Cheks run in the low 100s most of the time but by this reading she's running 200 at times. Previously hemoglobin A1c was 7.2% in the Fall 2013. Blood pressure is also elevated today but she says that is running fine at home. Has not had diabetic eye exam in some time. Was reminded to do so.    Review of Systems     Objective:   Physical Exam Neck : is supple without JVD, thyromegaly, or carotid bruits. Chest clear to auscultation. Cardiac exam regular rate and rhythm normal S1 and S2. Extremities: diabetic foot exam without ulcers or calluses and pulses are normal          Assessment & Plan:  Insulin-dependent diabetes  Plan: Continue to monitor blood pressure at home. Patient is to make a record of Accu-Cheks 3 times a day and return in 2 weeks.   Hypertension  Insulin-dependent diabetes mellitus  Plan: Patient is to keep an eye on her blood pressure and let me know what reading she gets at home. Is to return in 2 weeks with 3 times a day Accu-Chek readings.

## 2012-06-08 NOTE — Patient Instructions (Addendum)
Return in 2 weeks. Take Accu-Cheks 3 times a day and bring readings with you.

## 2012-06-29 ENCOUNTER — Ambulatory Visit (INDEPENDENT_AMBULATORY_CARE_PROVIDER_SITE_OTHER): Payer: Medicare Other | Admitting: Internal Medicine

## 2012-06-29 ENCOUNTER — Encounter: Payer: Self-pay | Admitting: Internal Medicine

## 2012-06-29 VITALS — BP 152/70 | HR 92 | Wt 162.0 lb

## 2012-06-29 DIAGNOSIS — E119 Type 2 diabetes mellitus without complications: Secondary | ICD-10-CM

## 2012-06-29 DIAGNOSIS — Z794 Long term (current) use of insulin: Secondary | ICD-10-CM

## 2012-06-29 DIAGNOSIS — N189 Chronic kidney disease, unspecified: Secondary | ICD-10-CM

## 2012-06-29 DIAGNOSIS — I10 Essential (primary) hypertension: Secondary | ICD-10-CM

## 2012-06-29 NOTE — Progress Notes (Signed)
  Subjective:    Patient ID: Erin [name], female    DOB: 01-15-1946, 67 y.o.   MRN: [id number]  HPI  67 year old Black female for follow up of diabetes. Has brought in accucheck readings taken tid.   Taking Lantus 40 units at bedtime. Takes 4 units regular insulin before meals. Brings in multiple Accu-Chek readings that are running 125-151 in the mornings , 99-107 before lunch, 99-105 before dinner. Says she doesn't always sleep well at night and wonders if that is why Accu-Cheks are higher in the morning. Tends to eat a bit more at supper. Recent hemoglobin A1c in early May was 8.6%. In March it was 9.1%. Says she is out of test strips and they will be reordered.    Review of Systems     Objective:   Physical Exam neck is supple without JVD thyromegaly or carotid bruits. Chest clear to auscultation. Cardiac exam regular rate and rhythm normal S1 and S2. Extremities without edema        Assessment & Plan:   IDDM  Increase Lantus to 42 units hs. Continue to monitor accuchecks daily at various times of day ac.  Return in 6 months for 6 month recheck. OV, BP check, Hgb AIC, lipid and liver panels. Patient complaining about cost of recent office visits. Is to keep an eye on her Accu-Cheks and let me know if they are not well-controlled.

## 2012-06-29 NOTE — Patient Instructions (Addendum)
Increase Lantus to 42 units before bedtime. Continue 4 units of regular insulin before meals 3 times daily and return in 6 months

## 2012-07-24 ENCOUNTER — Other Ambulatory Visit: Payer: Self-pay

## 2012-07-24 MED ORDER — OLMESARTAN MEDOXOMIL 40 MG PO TABS: 40.0000 mg | ORAL_TABLET | Freq: Every day | ORAL | Status: DC

## 2012-09-06 ENCOUNTER — Other Ambulatory Visit: Payer: Self-pay | Admitting: Internal Medicine

## 2012-10-30 ENCOUNTER — Other Ambulatory Visit: Payer: Self-pay

## 2012-10-30 MED ORDER — OLMESARTAN MEDOXOMIL 40 MG PO TABS: 40.0000 mg | ORAL_TABLET | Freq: Every day | ORAL | Status: DC

## 2012-11-02 ENCOUNTER — Telehealth: Payer: Self-pay

## 2012-11-02 DIAGNOSIS — Z1239 Encounter for other screening for malignant neoplasm of breast: Secondary | ICD-10-CM

## 2012-12-22 ENCOUNTER — Ambulatory Visit (HOSPITAL_COMMUNITY)
Admission: RE | Admit: 2012-12-22 | Discharge: 2012-12-22 | Disposition: A | Discharge status: Home / Self Care | Payer: Medicare Other | Source: Ambulatory Visit | Attending: Internal Medicine | Admitting: Internal Medicine

## 2012-12-22 DIAGNOSIS — Z1231 Encounter for screening mammogram for malignant neoplasm of breast: Secondary | ICD-10-CM | POA: Insufficient documentation

## 2013-01-04 ENCOUNTER — Other Ambulatory Visit: Payer: Medicare Other | Admitting: Internal Medicine

## 2013-01-04 DIAGNOSIS — N189 Chronic kidney disease, unspecified: Secondary | ICD-10-CM

## 2013-01-04 DIAGNOSIS — I10 Essential (primary) hypertension: Secondary | ICD-10-CM | POA: Diagnosis not present

## 2013-01-04 DIAGNOSIS — Z79899 Other long term (current) drug therapy: Secondary | ICD-10-CM | POA: Diagnosis not present

## 2013-01-04 DIAGNOSIS — E785 Hyperlipidemia, unspecified: Secondary | ICD-10-CM

## 2013-01-04 DIAGNOSIS — Z794 Long term (current) use of insulin: Secondary | ICD-10-CM | POA: Diagnosis not present

## 2013-01-04 DIAGNOSIS — IMO0001 Reserved for inherently not codable concepts without codable children: Secondary | ICD-10-CM

## 2013-01-04 DIAGNOSIS — E119 Type 2 diabetes mellitus without complications: Secondary | ICD-10-CM | POA: Diagnosis not present

## 2013-01-04 LAB — HEPATIC FUNCTION PANEL
Albumin: 4.3 g/dL (ref 3.5–5.2)
Alkaline Phosphatase: 82 U/L (ref 39–117)
Bilirubin, Direct: 0.1 mg/dL (ref 0.0–0.3)
Total Bilirubin: 0.9 mg/dL (ref 0.3–1.2)
Total Protein: 6.7 g/dL (ref 6.0–8.3)

## 2013-01-04 LAB — HEMOGLOBIN A1C: Mean Plasma Glucose: 192 mg/dL — ABNORMAL HIGH (ref <117)

## 2013-01-04 LAB — LIPID PANEL
HDL: 42 mg/dL (ref >39)
LDL Cholesterol: 182 mg/dL — ABNORMAL HIGH (ref 0–99)
Total CHOL/HDL Ratio: 6.3 Ratio
Triglycerides: 197 mg/dL — ABNORMAL HIGH (ref <150)

## 2013-01-05 ENCOUNTER — Ambulatory Visit (INDEPENDENT_AMBULATORY_CARE_PROVIDER_SITE_OTHER): Payer: Medicare Other | Admitting: Internal Medicine

## 2013-01-05 ENCOUNTER — Encounter: Payer: Self-pay | Admitting: Internal Medicine

## 2013-01-05 ENCOUNTER — Other Ambulatory Visit: Payer: Self-pay | Admitting: *Deleted

## 2013-01-05 VITALS — BP 190/100 | HR 100 | Temp 99.1°F | Ht 61.5 in | Wt 160.0 lb

## 2013-01-05 DIAGNOSIS — I10 Essential (primary) hypertension: Secondary | ICD-10-CM

## 2013-01-05 MED ORDER — ALPRAZOLAM 0.5 MG PO TABS: 0.5000 mg | ORAL_TABLET | Freq: Two times a day (BID) | ORAL | Status: DC | PRN

## 2013-01-05 MED ORDER — INSULIN ASPART 100 UNIT/ML ~~LOC~~ SOLN: 4.0000 [IU] | Freq: Three times a day (TID) | SUBCUTANEOUS | Status: DC

## 2013-01-05 NOTE — Patient Instructions (Addendum)
Return next week for BP check. Make sure you're taking furosemide instead of HCTZ

## 2013-01-10 ENCOUNTER — Telehealth: Payer: Self-pay | Admitting: Internal Medicine

## 2013-01-11 ENCOUNTER — Other Ambulatory Visit: Payer: Self-pay | Admitting: *Deleted

## 2013-01-11 MED ORDER — FUROSEMIDE 40 MG PO TABS: 40.0000 mg | ORAL_TABLET | Freq: Every day | ORAL | Status: DC

## 2013-01-11 NOTE — Telephone Encounter (Signed)
Called patient, left message to return the call.

## 2013-01-11 NOTE — Telephone Encounter (Signed)
Spoke with patient to advise to STOP the HCTZ.  Start Lasix 40mg  daily.  Rx e-scribed to [facility] at [facility]).  Patient verbalized understanding of instructions.  Given return appointment in 2 weeks for 12/29 @ 11:45 a.m. And we'll draw a BMET at that time.

## 2013-01-11 NOTE — Telephone Encounter (Signed)
Stop Hydrodiuril. Start Lasix 40 mg daily and return in 2 weeks for OV and B-met.

## 2013-01-17 ENCOUNTER — Other Ambulatory Visit: Payer: Self-pay | Admitting: *Deleted

## 2013-01-17 MED ORDER — INSULIN ASPART 100 UNIT/ML ~~LOC~~ SOLN: 4.0000 [IU] | Freq: Three times a day (TID) | SUBCUTANEOUS | Status: DC

## 2013-01-29 ENCOUNTER — Ambulatory Visit (INDEPENDENT_AMBULATORY_CARE_PROVIDER_SITE_OTHER): Payer: Medicare Other | Admitting: Internal Medicine

## 2013-01-29 ENCOUNTER — Encounter: Payer: Self-pay | Admitting: Internal Medicine

## 2013-01-29 VITALS — BP 162/64 | HR 96 | Wt 162.0 lb

## 2013-01-29 DIAGNOSIS — I10 Essential (primary) hypertension: Secondary | ICD-10-CM

## 2013-01-29 LAB — BASIC METABOLIC PANEL
BUN: 20 mg/dL (ref 6–23)
CO2: 27 mEq/L (ref 19–32)
Calcium: 9.8 mg/dL (ref 8.4–10.5)
Chloride: 102 mEq/L (ref 96–112)
Creat: 1.48 mg/dL — ABNORMAL HIGH (ref 0.50–1.10)
Glucose, Bld: 147 mg/dL — ABNORMAL HIGH (ref 70–99)
Sodium: 140 mEq/L (ref 135–145)

## 2013-01-29 MED ORDER — FUROSEMIDE 40 MG PO TABS: 40.0000 mg | ORAL_TABLET | Freq: Every day | ORAL | Status: DC

## 2013-01-29 NOTE — Patient Instructions (Addendum)
Take BP daily for one week and call me with results.

## 2013-01-29 NOTE — Progress Notes (Signed)
   Subjective:    Patient ID: Erin [name], female    DOB: [birth year], 67 y.o.   MRN: [id number]  HPI Patient is on Lasix 40 mg daily. For some reason she got a refill on HCTZ  25 mg daily but is not supposed to be taking that. Clear 5 with her today she is only to take furosemide. Blood pressure is elevated today in the office. She says it's running fine at home when she takes it and at [employer]. Her daughter is a Associate Professor and she is able to check it regularly. Have asked her to have her daughter check it daily for one week and call me next week with results. She is already on Benicar 40 mg daily and amlodipine.    Review of Systems     Objective:   Physical Exam Spent 10 minutes with patient speaking with her about her medications. Denies noncompliance.       Assessment & Plan:  Hypertension  History of stage III kidney disease  Diabetes mellitus  Hyperlipidemia  Plan: Call with blood pressure results after taking it daily for one week. May need to consider Cardura if blood pressure is staying elevated consistently. It is not clear to me that that is the case.

## 2013-02-01 NOTE — Progress Notes (Signed)
   Subjective:    Patient ID: Dorothy PufferBetty J Vine, female    DOB: 06-02-1945, 68 y.o.   MRN: [id number]  HPI  I had hoped her blood pressure would have improved since last visit. Blood pressure remains elevated but she says at home her blood pressures running fine. When asked her daughter, Clydie BraunKaren a to take it on a weekly basis and call me with results in one week.    Review of Systems     Objective:   Physical Exam Chest clear. Cardiac exam regular rate and rhythm. Extremities without pitting edema.       Assessment & Plan:  Hypertension  Chronic kidney disease  Diabetes mellitus  Hyperlipidemia  Plan:: Blood pressure readings in one week. Daughter is to take her blood pressure daily. She is on several drugs at the present time. We may need to add Cardura for improved blood pressure control. If we increase amlodipine I think it will cause her feet to swell.

## 2013-03-01 ENCOUNTER — Other Ambulatory Visit: Payer: Self-pay

## 2013-03-01 MED ORDER — ATORVASTATIN CALCIUM 10 MG PO TABS: 10.0000 mg | ORAL_TABLET | Freq: Every day | ORAL | Status: DC

## 2013-04-10 ENCOUNTER — Other Ambulatory Visit: Payer: Self-pay

## 2013-04-10 MED ORDER — OLMESARTAN MEDOXOMIL 40 MG PO TABS: 40.0000 mg | ORAL_TABLET | Freq: Every day | ORAL | Status: DC

## 2013-07-19 ENCOUNTER — Other Ambulatory Visit: Payer: Medicare Other | Admitting: Internal Medicine

## 2013-07-20 ENCOUNTER — Encounter: Payer: Medicare Other | Admitting: Internal Medicine

## 2013-07-24 ENCOUNTER — Other Ambulatory Visit: Payer: Self-pay

## 2013-07-24 MED ORDER — FUROSEMIDE 40 MG PO TABS
40.0000 mg | ORAL_TABLET | Freq: Every day | ORAL | Status: DC
Start: 2013-07-24 — End: 2014-02-21

## 2013-09-04 ENCOUNTER — Other Ambulatory Visit: Payer: Medicare Other | Admitting: Internal Medicine

## 2013-09-04 DIAGNOSIS — Z1329 Encounter for screening for other suspected endocrine disorder: Secondary | ICD-10-CM

## 2013-09-04 DIAGNOSIS — Z130 Encounter for screening for diseases of the blood and blood-forming organs and certain disorders involving the immune mechanism: Secondary | ICD-10-CM

## 2013-09-04 DIAGNOSIS — Z79899 Other long term (current) drug therapy: Secondary | ICD-10-CM

## 2013-09-04 DIAGNOSIS — E785 Hyperlipidemia, unspecified: Secondary | ICD-10-CM | POA: Diagnosis not present

## 2013-09-04 DIAGNOSIS — E119 Type 2 diabetes mellitus without complications: Secondary | ICD-10-CM

## 2013-09-04 DIAGNOSIS — I10 Essential (primary) hypertension: Secondary | ICD-10-CM

## 2013-09-04 LAB — CBC WITH DIFFERENTIAL/PLATELET
BASOS ABS: 0.1 10*3/uL (ref 0.0–0.1)
Basophils Relative: 1 % (ref 0–1)
EOS PCT: 1 % (ref 0–5)
Eosinophils Absolute: 0.1 10*3/uL (ref 0.0–0.7)
HCT: 37.1 % (ref 36.0–46.0)
Hemoglobin: 12.2 g/dL (ref 12.0–15.0)
LYMPHS ABS: 2.2 10*3/uL (ref 0.7–4.0)
LYMPHS PCT: 37 % (ref 12–46)
MCH: 25.1 pg — ABNORMAL LOW (ref 26.0–34.0)
MCHC: 32.9 g/dL (ref 30.0–36.0)
MCV: 76.2 fL — AB (ref 78.0–100.0)
Monocytes Absolute: 0.5 10*3/uL (ref 0.1–1.0)
Monocytes Relative: 8 % (ref 3–12)
NEUTROS ABS: 3.2 10*3/uL (ref 1.7–7.7)
NEUTROS PCT: 53 % (ref 43–77)
PLATELETS: 356 10*3/uL (ref 150–400)
RBC: 4.87 MIL/uL (ref 3.87–5.11)
RDW: 15.3 % (ref 11.5–15.5)
WBC: 6 10*3/uL (ref 4.0–10.5)

## 2013-09-04 LAB — COMPREHENSIVE METABOLIC PANEL
ALBUMIN: 4.1 g/dL (ref 3.5–5.2)
ALT: 18 U/L (ref 0–35)
AST: 15 U/L (ref 0–37)
Alkaline Phosphatase: 99 U/L (ref 39–117)
BUN: 22 mg/dL (ref 6–23)
CHLORIDE: 102 meq/L (ref 96–112)
CO2: 23 mEq/L (ref 19–32)
Calcium: 9.9 mg/dL (ref 8.4–10.5)
Creat: 1.6 mg/dL — ABNORMAL HIGH (ref 0.50–1.10)
Glucose, Bld: 229 mg/dL — ABNORMAL HIGH (ref 70–99)
Potassium: 4.7 mEq/L (ref 3.5–5.3)
SODIUM: 137 meq/L (ref 135–145)
Total Bilirubin: 0.6 mg/dL (ref 0.2–1.2)
Total Protein: 6.9 g/dL (ref 6.0–8.3)

## 2013-09-04 LAB — LIPID PANEL
CHOL/HDL RATIO: 7.1 ratio
Cholesterol: 284 mg/dL — ABNORMAL HIGH (ref 0–200)
HDL: 40 mg/dL (ref >39)
LDL Cholesterol: 198 mg/dL — ABNORMAL HIGH (ref 0–99)
Triglycerides: 232 mg/dL — ABNORMAL HIGH (ref <150)
VLDL: 46 mg/dL — AB (ref 0–40)

## 2013-09-04 LAB — HEMOGLOBIN A1C
Hgb A1c MFr Bld: 11 % — ABNORMAL HIGH (ref <5.7)
Mean Plasma Glucose: 269 mg/dL — ABNORMAL HIGH (ref <117)

## 2013-09-05 LAB — TSH: TSH: 3.152 u[IU]/mL (ref 0.350–4.500)

## 2013-09-06 ENCOUNTER — Encounter: Payer: Self-pay | Admitting: Internal Medicine

## 2013-09-06 ENCOUNTER — Ambulatory Visit (INDEPENDENT_AMBULATORY_CARE_PROVIDER_SITE_OTHER): Payer: Medicare Other | Admitting: Internal Medicine

## 2013-09-06 VITALS — BP 170/86 | HR 76 | Ht 61.5 in | Wt 159.0 lb

## 2013-09-06 DIAGNOSIS — IMO0001 Reserved for inherently not codable concepts without codable children: Secondary | ICD-10-CM

## 2013-09-06 DIAGNOSIS — N184 Chronic kidney disease, stage 4 (severe): Secondary | ICD-10-CM | POA: Diagnosis not present

## 2013-09-06 DIAGNOSIS — F411 Generalized anxiety disorder: Secondary | ICD-10-CM

## 2013-09-06 DIAGNOSIS — E8881 Metabolic syndrome: Secondary | ICD-10-CM | POA: Diagnosis not present

## 2013-09-06 DIAGNOSIS — I10 Essential (primary) hypertension: Secondary | ICD-10-CM

## 2013-09-06 DIAGNOSIS — E1165 Type 2 diabetes mellitus with hyperglycemia: Principal | ICD-10-CM

## 2013-09-06 LAB — POCT URINALYSIS DIPSTICK
Bilirubin, UA: NEGATIVE
Blood, UA: NEGATIVE
Glucose, UA: NEGATIVE
Ketones, UA: NEGATIVE
Leukocytes, UA: NEGATIVE
NITRITE UA: NEGATIVE
PROTEIN UA: NEGATIVE
Spec Grav, UA: 1.01
UROBILINOGEN UA: NEGATIVE
pH, UA: 5.5

## 2013-09-06 NOTE — Progress Notes (Signed)
Subjective:    Patient ID: Erin [name], female    DOB: 12-15-1945, 68 y.o.   MRN: [id number]  HPI  68 year old Black Female in today for health maintenance and evaluation of medical issues. Has a history of diabetes mellitus, hypertension, chronic kidney disease. Did not take blood pressure medications today before coming to office. Blood pressure is elevated. Thought she was not supposed to do so since she was fasting. Was seen in December her blood pressure was elevated. Says blood pressure runs fine at home but she gets excited come in to the office. Suggested she take some Xanax before coming to the office but she says it might make her drowsy driving.  [facility]. Is on statin medication as well as Lantus insulin and NovoLog. She was admitted to the hospital in 2008-06-17 with renal failure. Was found to be in diabetic ketoacidosis with hyperosmolar state. Blood sugar was 1212. She was dehydrated. Creatinine was 4.90.  Social history: She has 3 children. She and her husband were married for 25 years and he died of a massive MI in 06-17-2004. She was born in [place] and grew up in [place]. 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 Does not smoke or consume alcohol.  Family history: Father died in his 43s of diabetes. One sister and brother who are healthy. Mother living with history of breast cancer and diabetes.  Creatinine has been stable between 1.48 and 1.6  She is on Benicar, Lasix, statin medication and amlodipine.      Review of Systems  Constitutional: Positive for fatigue.  HENT: Negative.   Eyes:       Needs to have diabetic eye exam   Respiratory: Negative.   Cardiovascular: Negative for chest pain.  Genitourinary: Negative.   Neurological: Negative.   Psychiatric/Behavioral:       Anxiety depression and grief over husband's death         Objective:   Physical Exam  Vitals reviewed. Constitutional: She is oriented to person, place, and time. She appears well-developed and well-nourished. No distress.  HENT:  Head: Normocephalic and atraumatic.  Right Ear: External ear normal.  Left Ear: External ear normal.  Mouth/Throat: Oropharynx is clear and moist. No oropharyngeal exudate.  Eyes: Conjunctivae and EOM are normal. Pupils are equal, round, and reactive to light. Right eye exhibits no discharge. Left eye exhibits no discharge. No scleral icterus.  Neck: Neck supple. No JVD present. No thyromegaly present.  Cardiovascular: Normal rate, regular rhythm, normal heart sounds and intact distal pulses.   No murmur heard. Pulmonary/Chest: Effort normal and breath sounds normal. No respiratory distress. She has no rales.  Abdominal: Soft. Bowel sounds are normal. She exhibits no distension. There is no tenderness. There is no rebound and no guarding.  Genitourinary:  Pap taken in 06-17-2012. Bimanual normal.  Musculoskeletal: Normal range of motion. She exhibits no edema.  Lymphadenopathy:    She has no cervical adenopathy.  Neurological: She is alert and oriented to person, place, and time. She has normal reflexes. She displays normal reflexes. No cranial nerve deficit. She exhibits normal muscle tone. Coordination normal.  Skin: Skin is warm and dry. No rash noted. She is not diaphoretic.  Psychiatric: She has a normal mood and affect. Her behavior is normal. Judgment and thought content normal.          Assessment & Plan:  Insulin-dependent diabetes  Hyperlipidemia  Chronic kidney disease stage III  Anxiety  Metabolic syndrome  Plan: Patient has not taken blood pressure medications today before coming to office. History turning 68 weeks for repeat blood pressure check having taken all blood pressure medications 2 hours before coming to office     Subjective:   Patient presents for Medicare Annual/Subsequent  preventive examination.  Review Past Medical/Family/Social:see above   Risk Factors  Current exercise habits: sedentary Dietary issues discussed: Low fat low carb  Cardiac risk factors: HTN DM Hyperliidemia  Depression Screen  (Note: if answer to either of the following is "Yes", a more complete depression screening is indicated)   Over the past two weeks, have you felt down, depressed or hopeless? No  Over the past two weeks, have you felt little interest or pleasure in doing things? No Have you lost interest or pleasure in daily life? No Do you often feel hopeless? No Do you cry easily over simple problems? No   Activities of Daily Living  In your present state of health, do you have any difficulty performing the following activities?:   Driving? No  Managing money? No  Feeding yourself? No  Getting from bed to chair? No  Climbing a flight of stairs? No  Preparing food and eating?: No  Bathing or showering? No  Getting dressed: No  Getting to the toilet? No  Using the toilet:No  Moving around from place to place: No  In the past year have you fallen or had a near fall?:No  Are you sexually active? No  Do you have more than one partner? No   Hearing Difficulties: No  Do you often ask people to speak up or repeat themselves? No  Do you experience ringing or noises in your ears? No  Do you have difficulty understanding soft or whispered voices? No  Do you feel that you have a problem with memory? No Do you often misplace items? No    Home Safety:  Do you have a smoke alarm at your residence? Yes Do you have grab bars in the bathroom? No Do you have throw rugs in your house? yes   Cognitive Testing  Alert? Yes Normal Appearance?Yes  Oriented to person? Yes Place? Yes  Time? Yes  Recall of three objects? Yes  Can perform simple calculations? Yes  Displays appropriate judgment?Yes  Can read the correct time from a watch face?Yes   List the Names of Other  Physician/Practitioners you currently use:  See referral list for the physicians patient is currently seeing.   Eye physician  Review of Systems:see above   Objective:     General appearance: Appears stated age  Head: Normocephalic, without obvious abnormality, atraumatic  Eyes: conj clear, EOMi PEERLA  Ears: normal TM's and external ear canals both ears  Nose: Nares normal. Septum midline. Mucosa normal. No drainage or sinus tenderness.  Throat: lips, mucosa, and tongue normal; teeth and gums normal  Neck: no adenopathy, no carotid bruit, no JVD, supple, symmetrical, trachea midline and thyroid not enlarged, symmetric, no tenderness/mass/nodules  No CVA tenderness.  Lungs: clear to auscultation bilaterally  Breasts: normal appearance, no masses or tenderness Heart: regular rate and rhythm, S1, S2 normal, no murmur, click, rub or gallop  Abdomen: soft, non-tender; bowel sounds normal; no masses, no organomegaly  Musculoskeletal: ROM normal in all joints, no crepitus, no deformity, Normal muscle strengthen. Back  is symmetric, no curvature. Skin: Skin color, texture, turgor normal. No rashes or lesions  Lymph nodes: Cervical, supraclavicular, and axillary nodes normal.  Neurologic: CN 2 -12 Normal, Normal symmetric reflexes. Normal coordination and gait  Psych: Alert & Oriented x 3, Mood appear stable.    Assessment:    Annual wellness medicare exam   Plan:    During the course of the visit the patient was educated and counseled about appropriate screening and preventive services including:   Annual mammogram  3 hemoccult cards given. Declines colonoscopy     Patient Instructions (the written plan) was given to the patient.  Medicare Attestation  I have personally reviewed:  The patient's medical and social history  Their use of alcohol, tobacco or illicit drugs  Their current medications and supplements  The patient's functional ability including ADLs,fall risks,  home safety risks, cognitive, and hearing and visual impairment  Diet and physical activities  Evidence for depression or mood disorders  The patient's weight, height, BMI, and visual acuity have been recorded in the chart. I have made referrals, counseling, and provided education to the patient based on review of the above and I have provided the patient with a written personalized care plan for preventive services.

## 2013-09-07 LAB — MICROALBUMIN, URINE: MICROALB UR: 0.5 mg/dL (ref 0.00–1.89)

## 2013-09-11 ENCOUNTER — Other Ambulatory Visit: Payer: Self-pay

## 2013-09-11 MED ORDER — ATORVASTATIN CALCIUM 10 MG PO TABS: 10.0000 mg | ORAL_TABLET | Freq: Every day | ORAL | Status: DC

## 2013-09-11 MED ORDER — ROSUVASTATIN CALCIUM 10 MG PO TABS: 10.0000 mg | ORAL_TABLET | Freq: Every day | ORAL | Status: DC

## 2013-09-11 NOTE — Telephone Encounter (Signed)
Patient will receive Lipitor 10mg  daily for approx 2 more months, then will need to switch to Crestor 10mg  in order to get it free with patient Assistance program

## 2013-09-28 ENCOUNTER — Ambulatory Visit: Payer: Medicare Other | Admitting: Internal Medicine

## 2013-10-01 ENCOUNTER — Ambulatory Visit (INDEPENDENT_AMBULATORY_CARE_PROVIDER_SITE_OTHER): Payer: Medicare Other | Admitting: Internal Medicine

## 2013-10-01 ENCOUNTER — Encounter: Payer: Self-pay | Admitting: Internal Medicine

## 2013-10-01 VITALS — BP 130/82 | HR 96 | Wt 157.5 lb

## 2013-10-01 DIAGNOSIS — I10 Essential (primary) hypertension: Secondary | ICD-10-CM

## 2013-10-01 DIAGNOSIS — IMO0001 Reserved for inherently not codable concepts without codable children: Secondary | ICD-10-CM

## 2013-10-01 DIAGNOSIS — R030 Elevated blood-pressure reading, without diagnosis of hypertension: Secondary | ICD-10-CM | POA: Diagnosis not present

## 2013-10-01 MED ORDER — ALPRAZOLAM 0.5 MG PO TABS: 0.5000 mg | ORAL_TABLET | Freq: Two times a day (BID) | ORAL | Status: DC | PRN

## 2013-10-17 ENCOUNTER — Other Ambulatory Visit: Payer: Self-pay

## 2013-10-17 MED ORDER — OLMESARTAN MEDOXOMIL 40 MG PO TABS: 40.0000 mg | ORAL_TABLET | Freq: Every day | ORAL | Status: DC

## 2013-10-23 ENCOUNTER — Other Ambulatory Visit: Payer: Self-pay

## 2013-10-23 MED ORDER — OLMESARTAN MEDOXOMIL 40 MG PO TABS: 40.0000 mg | ORAL_TABLET | Freq: Every day | ORAL | Status: DC

## 2013-10-26 ENCOUNTER — Encounter: Payer: Self-pay | Admitting: Internal Medicine

## 2013-10-26 NOTE — Patient Instructions (Signed)
Has not taken blood pressure medications today. Return in 3 weeks and take blood pressure medications 2 hours before coming to the office.

## 2013-10-26 NOTE — Progress Notes (Signed)
   Subjective:    Patient ID: Erin [name], female    DOB: May 12, 1945, 68 y.o.   MRN: [id number]  HPI In today to followup on hypertension. Last visit she had not taken her medications before coming to the office. This time she took all of her medications at least 2 hours before coming. Initially blood pressure was 168/76 but after talking with her her and getting her to relax blood pressure improved to 130/82    Review of Systems     Objective:   Physical Exam Neck supple without JVD thyromegaly or carotid bruits. Chest clear to auscultation. Cardiac exam regular rate and rhythm. Extremities without edema.      Assessment & Plan:  Hypertension  Chronic kidney disease  Diabetes mellitus  Metabolic syndrome  Office hypertension  History of anxiety  Hyperlipidemia  Plan: I am pleased with knowing her blood pressure comes down when she relaxes. Continue same medications and return in 6 months.

## 2013-10-26 NOTE — Patient Instructions (Signed)
Continue same medications and return in 6 months 

## 2013-12-11 ENCOUNTER — Other Ambulatory Visit: Payer: Self-pay

## 2013-12-11 MED ORDER — INSULIN GLARGINE 100 UNIT/ML ~~LOC~~ SOLN: 40.0000 [IU] | Freq: Every day | SUBCUTANEOUS | Status: DC

## 2014-02-20 ENCOUNTER — Other Ambulatory Visit: Payer: Self-pay | Admitting: *Deleted

## 2014-02-20 MED ORDER — INSULIN ASPART 100 UNIT/ML ~~LOC~~ SOLN: 4.0000 [IU] | Freq: Three times a day (TID) | SUBCUTANEOUS | Status: DC

## 2014-02-20 NOTE — Telephone Encounter (Signed)
Refill on Novolog sent to patient pharmacy

## 2014-02-21 ENCOUNTER — Telehealth: Payer: Self-pay | Admitting: Internal Medicine

## 2014-02-21 MED ORDER — FUROSEMIDE 40 MG PO TABS: 40.0000 mg | ORAL_TABLET | Freq: Every day | ORAL | Status: DC

## 2014-02-21 NOTE — Telephone Encounter (Signed)
[facility] Novolg refilled also.

## 2014-03-11 ENCOUNTER — Other Ambulatory Visit: Payer: Self-pay | Admitting: *Deleted

## 2014-03-11 MED ORDER — FUROSEMIDE 40 MG PO TABS: 40.0000 mg | ORAL_TABLET | Freq: Every day | ORAL | Status: DC

## 2014-03-11 NOTE — Telephone Encounter (Signed)
Refill sent for furosemide to patient pharmacy

## 2014-03-12 ENCOUNTER — Other Ambulatory Visit: Payer: Self-pay | Admitting: *Deleted

## 2014-03-12 MED ORDER — AMLODIPINE BESYLATE 10 MG PO TABS: 10.0000 mg | ORAL_TABLET | Freq: Every day | ORAL | Status: DC

## 2014-03-21 ENCOUNTER — Telehealth: Payer: Self-pay | Admitting: Internal Medicine

## 2014-03-21 NOTE — Telephone Encounter (Signed)
Received call yesterday that patient's mother, Drexel Ihaina Graves had passed away. Tried to call patient but got no answer. Sympathy card sent. Mother had complications of congestive heart failure and septicemia from decubiti.

## 2014-03-21 NOTE — Telephone Encounter (Signed)
Tried to call no answer. Sent card.

## 2014-03-21 NOTE — Telephone Encounter (Signed)
I called to confirm pt's follow up appointment tomorrow and was informed that pt needed to cancel the appointment, pt's mother had passed away.  Best number to call [phone] / lt

## 2014-03-22 ENCOUNTER — Ambulatory Visit: Payer: Medicare Other | Admitting: Internal Medicine

## 2014-04-18 ENCOUNTER — Ambulatory Visit (INDEPENDENT_AMBULATORY_CARE_PROVIDER_SITE_OTHER): Payer: Medicare Other | Admitting: Internal Medicine

## 2014-04-18 ENCOUNTER — Encounter: Payer: Self-pay | Admitting: Internal Medicine

## 2014-04-18 VITALS — BP 172/72 | HR 103 | Temp 98.1°F | Wt 156.0 lb

## 2014-04-18 DIAGNOSIS — N189 Chronic kidney disease, unspecified: Secondary | ICD-10-CM

## 2014-04-18 DIAGNOSIS — E785 Hyperlipidemia, unspecified: Secondary | ICD-10-CM | POA: Diagnosis not present

## 2014-04-18 DIAGNOSIS — E119 Type 2 diabetes mellitus without complications: Secondary | ICD-10-CM | POA: Diagnosis not present

## 2014-04-18 DIAGNOSIS — F4321 Adjustment disorder with depressed mood: Secondary | ICD-10-CM

## 2014-04-18 DIAGNOSIS — I10 Essential (primary) hypertension: Secondary | ICD-10-CM | POA: Diagnosis not present

## 2014-04-18 NOTE — Progress Notes (Signed)
   Subjective:    Patient ID: Erin [name], female    DOB: 1945/05/19, 69 y.o.   MRN: [id number]  HPI In today for follow-up on diabetes, hypertension, hyperlipidemia, chronic kidney disease, metabolic syndrome. Unfortunately her mother passed away a few weeks ago. Patient having significant grief reaction. Has Xanax to take but hasn't been taking it on a regular basis. Not sleeping well. Blood pressure is elevated today at 172/72. She is tearful in the office today and we talked at length about her mother who is also a patient here and her mothers medical issues. There've been some family conflict surrounding the funeral as well.  Unfortunately, patient did not have labs prior to this appointment. We will do that in the near future. She did not take flu vaccine this year.  Says Accu-Cheks recently was 109.    Review of Systems     Objective:   Physical Exam  Skin warm and dry. Nodes none. Neck supple without JVD thyromegaly or carotid bruits. Chest clear to auscultation. Cardiac exam regular rate and rhythm. Extremities without edema      Assessment & Plan:  Control type 2 diabetes  Hypertension-systolic blood pressure elevated today but patient is upset. Grief reaction  Hyperlipidemia  Chronic kidney disease  Grief reaction-patient has Xanax to take and should take that twice daily  Plan: In 2-3 weeks she'll have fasting lipid panel, hemoglobin A1c, basic metabolic panel, urine for microalbuminuria along with office visit and repeat blood pressure check.  25 minutes spent with patient

## 2014-04-18 NOTE — Patient Instructions (Signed)
Return in 2-3 weeks. Have fasting labs before office visit. Take Xanax twice daily for anxiety

## 2014-05-10 ENCOUNTER — Other Ambulatory Visit: Payer: Medicare Other | Admitting: Internal Medicine

## 2014-05-10 DIAGNOSIS — E119 Type 2 diabetes mellitus without complications: Secondary | ICD-10-CM | POA: Diagnosis not present

## 2014-05-10 DIAGNOSIS — Z79899 Other long term (current) drug therapy: Secondary | ICD-10-CM | POA: Diagnosis not present

## 2014-05-10 DIAGNOSIS — E785 Hyperlipidemia, unspecified: Secondary | ICD-10-CM | POA: Diagnosis not present

## 2014-05-10 LAB — COMPLETE METABOLIC PANEL WITH GFR
ALT: 17 U/L (ref 0–35)
AST: 16 U/L (ref 0–37)
Albumin: 4.7 g/dL (ref 3.5–5.2)
Alkaline Phosphatase: 110 U/L (ref 39–117)
BUN: 28 mg/dL — ABNORMAL HIGH (ref 6–23)
CO2: 25 mEq/L (ref 19–32)
CREATININE: 1.99 mg/dL — AB (ref 0.50–1.10)
Calcium: 10.2 mg/dL (ref 8.4–10.5)
Chloride: 99 mEq/L (ref 96–112)
GFR, EST AFRICAN AMERICAN: 29 mL/min — AB
GFR, Est Non African American: 25 mL/min — ABNORMAL LOW
GLUCOSE: 169 mg/dL — AB (ref 70–99)
POTASSIUM: 4.7 meq/L (ref 3.5–5.3)
Sodium: 137 mEq/L (ref 135–145)
Total Bilirubin: 0.7 mg/dL (ref 0.2–1.2)
Total Protein: 7.2 g/dL (ref 6.0–8.3)

## 2014-05-10 LAB — LIPID PANEL
CHOLESTEROL: 239 mg/dL — AB (ref 0–200)
HDL: 37 mg/dL — ABNORMAL LOW (ref >=46)
LDL Cholesterol: 162 mg/dL — ABNORMAL HIGH (ref 0–99)
Total CHOL/HDL Ratio: 6.5 Ratio
Triglycerides: 199 mg/dL — ABNORMAL HIGH (ref <150)
VLDL: 40 mg/dL (ref 0–40)

## 2014-05-10 LAB — HEPATIC FUNCTION PANEL
ALT: 17 U/L (ref 0–35)
AST: 16 U/L (ref 0–37)
Albumin: 4.7 g/dL (ref 3.5–5.2)
Alkaline Phosphatase: 110 U/L (ref 39–117)
Bilirubin, Direct: 0.1 mg/dL (ref 0.0–0.3)
Indirect Bilirubin: 0.6 mg/dL (ref 0.2–1.2)
TOTAL PROTEIN: 7.2 g/dL (ref 6.0–8.3)
Total Bilirubin: 0.7 mg/dL (ref 0.2–1.2)

## 2014-05-10 LAB — HEMOGLOBIN A1C
Hgb A1c MFr Bld: 8.9 % — ABNORMAL HIGH (ref <5.7)
Mean Plasma Glucose: 209 mg/dL — ABNORMAL HIGH (ref <117)

## 2014-05-11 LAB — MICROALBUMIN / CREATININE URINE RATIO
Creatinine, Urine: 44.3 mg/dL
Microalb Creat Ratio: 6.8 mg/g (ref 0.0–30.0)
Microalb, Ur: 0.3 mg/dL (ref <2.0)

## 2014-05-14 ENCOUNTER — Encounter: Payer: Self-pay | Admitting: Internal Medicine

## 2014-05-14 ENCOUNTER — Ambulatory Visit (INDEPENDENT_AMBULATORY_CARE_PROVIDER_SITE_OTHER): Payer: Medicare Other | Admitting: Internal Medicine

## 2014-05-14 VITALS — BP 120/80 | HR 95 | Temp 97.8°F | Wt 158.0 lb

## 2014-05-14 DIAGNOSIS — E785 Hyperlipidemia, unspecified: Secondary | ICD-10-CM

## 2014-05-14 DIAGNOSIS — I10 Essential (primary) hypertension: Secondary | ICD-10-CM | POA: Diagnosis not present

## 2014-05-14 DIAGNOSIS — N184 Chronic kidney disease, stage 4 (severe): Secondary | ICD-10-CM

## 2014-05-14 DIAGNOSIS — E119 Type 2 diabetes mellitus without complications: Secondary | ICD-10-CM | POA: Diagnosis not present

## 2014-05-14 MED ORDER — INSULIN GLARGINE 100 UNIT/ML ~~LOC~~ SOLN: 45.0000 [IU] | Freq: Every day | SUBCUTANEOUS | Status: DC

## 2014-05-14 MED ORDER — ROSUVASTATIN CALCIUM 20 MG PO TABS: 20.0000 mg | ORAL_TABLET | Freq: Every day | ORAL | Status: DC

## 2014-05-14 MED ORDER — INSULIN ASPART 100 UNIT/ML ~~LOC~~ SOLN: 5.0000 [IU] | Freq: Three times a day (TID) | SUBCUTANEOUS | Status: DC

## 2014-05-14 MED ORDER — ALPRAZOLAM 1 MG PO TABS
1.0000 mg | ORAL_TABLET | Freq: Two times a day (BID) | ORAL | Status: DC | PRN
Start: 2014-05-14 — End: 2015-01-13

## 2014-05-14 NOTE — Progress Notes (Signed)
   Subjective:    Patient ID: Erin [name], female    DOB: [birth year], 69 y.o.   MRN: [id number]  HPI 69 year old Black female in today to follow-up on , hyperlipidemia, insulin-dependent diabetes mellitus, chronic kidney disease stage IV. Creatinine is 1.99. She has missed some doses of Lipitor. Her mother died recently and she's been grieving. Not sleeping well at night. Will increase alprazolam  to 1 mg at bedtime.  Total cholesterol was 239 and previously was 284. Triglycerides are 199 and previously 232. LDL cholesterol 162 and previously 198. Will increase Lipitor to 20 mg daily. Hemoglobin A1c 8.9% and previously 11%. Am increasing Lantus to 45 units at bedtime and NovoLog to 5 units from 4 units to be given 3 times daily with meals. We'll follow-up with her in 4 months.  With regard to chronic kidney disease stage IV, creatinine 1.99-continue to monitor  Reminded about annual diabetic eye exam    Review of Systems     Objective:   Physical Exam Skin warm and dry. Nodes none. Chest clear. Cardiac exam regular rate and rhythm. Extremities without edema. Affect is one of grief not necessarily depression. Does not want to be on antidepressant therapy.       Assessment & Plan:  Essential hypertension  Chronic kidney disease stage IV  Insulin-dependent diabetes  Hypertension  Anxiety  Grief reaction  Plan: Return in 4 months. See changes above to medication regimen. Recommend annual diabetic eye exam.

## 2014-05-14 NOTE — Patient Instructions (Addendum)
Increase Crestor to 20 mg daily. Return in 4 months. Increase Xanax to 1 mg at bedtime. Increase Lantus to 45 Units at bedtime. Increase Novolg to 5 units 3 times a day

## 2014-09-19 ENCOUNTER — Other Ambulatory Visit: Payer: Medicare Other | Admitting: Internal Medicine

## 2014-09-19 DIAGNOSIS — Z794 Long term (current) use of insulin: Secondary | ICD-10-CM

## 2014-09-19 DIAGNOSIS — E785 Hyperlipidemia, unspecified: Secondary | ICD-10-CM | POA: Diagnosis not present

## 2014-09-19 DIAGNOSIS — IMO0001 Reserved for inherently not codable concepts without codable children: Secondary | ICD-10-CM

## 2014-09-19 DIAGNOSIS — E119 Type 2 diabetes mellitus without complications: Secondary | ICD-10-CM

## 2014-09-19 DIAGNOSIS — Z79899 Other long term (current) drug therapy: Secondary | ICD-10-CM

## 2014-09-19 LAB — LIPID PANEL
Cholesterol: 139 mg/dL (ref 125–200)
HDL: 39 mg/dL — ABNORMAL LOW (ref >=46)
LDL CALC: 73 mg/dL (ref <130)
Total CHOL/HDL Ratio: 3.6 Ratio (ref <=5.0)
Triglycerides: 135 mg/dL (ref <150)
VLDL: 27 mg/dL (ref <30)

## 2014-09-19 LAB — HEMOGLOBIN A1C
HEMOGLOBIN A1C: 8.6 % — AB (ref <5.7)
Mean Plasma Glucose: 200 mg/dL — ABNORMAL HIGH (ref <117)

## 2014-09-19 LAB — HEPATIC FUNCTION PANEL
ALK PHOS: 102 U/L (ref 33–130)
ALT: 20 U/L (ref 6–29)
AST: 15 U/L (ref 10–35)
Albumin: 4.4 g/dL (ref 3.6–5.1)
BILIRUBIN TOTAL: 0.7 mg/dL (ref 0.2–1.2)
Bilirubin, Direct: 0.1 mg/dL (ref <=0.2)
Indirect Bilirubin: 0.6 mg/dL (ref 0.2–1.2)
Total Protein: 7.1 g/dL (ref 6.1–8.1)

## 2014-09-20 ENCOUNTER — Ambulatory Visit (INDEPENDENT_AMBULATORY_CARE_PROVIDER_SITE_OTHER): Payer: Medicare Other | Admitting: Internal Medicine

## 2014-09-20 ENCOUNTER — Encounter: Payer: Self-pay | Admitting: Internal Medicine

## 2014-09-20 VITALS — BP 130/70 | HR 92 | Temp 97.9°F | Wt 160.0 lb

## 2014-09-20 DIAGNOSIS — F411 Generalized anxiety disorder: Secondary | ICD-10-CM

## 2014-09-20 DIAGNOSIS — E119 Type 2 diabetes mellitus without complications: Secondary | ICD-10-CM | POA: Diagnosis not present

## 2014-09-20 DIAGNOSIS — I10 Essential (primary) hypertension: Secondary | ICD-10-CM | POA: Diagnosis not present

## 2014-09-20 DIAGNOSIS — N183 Chronic kidney disease, stage 3 unspecified: Secondary | ICD-10-CM

## 2014-09-20 DIAGNOSIS — IMO0001 Reserved for inherently not codable concepts without codable children: Secondary | ICD-10-CM

## 2014-09-20 DIAGNOSIS — Z794 Long term (current) use of insulin: Secondary | ICD-10-CM | POA: Diagnosis not present

## 2014-09-20 MED ORDER — INSULIN GLARGINE 100 UNIT/ML SOLOSTAR PEN: 50.0000 [IU] | PEN_INJECTOR | Freq: Every day | SUBCUTANEOUS | Status: DC

## 2014-09-20 MED ORDER — INSULIN ASPART 100 UNIT/ML ~~LOC~~ SOLN: 7.0000 [IU] | Freq: Three times a day (TID) | SUBCUTANEOUS | Status: DC

## 2014-09-20 NOTE — Addendum Note (Signed)
Addended by: Margaree Mackintosh on: 09/20/2014 11:07 AM   Modules accepted: Level of Service

## 2014-09-20 NOTE — Progress Notes (Addendum)
   Subjective:    Patient ID: Erin [name], female    DOB: [birth year], 69 y.o.   MRN: [id number]  HPI  69 year old Black Female for follow up on  Chronic kidney disease, HTN, hyperlipidemia, anxiety, DM. Hemoglobin A1c is 8.6% and previously was 8.9%. She is on 45 units of Lantus insulin at bedtime and 5 units of NovoLog before meals. She says she never sees Accu-Cheks in the 200 range. Says they're more like 130 when she checks them. Says she checks them several times daily.  Patient have diabetic eye exam. She was reminded once again.  With regard to hyperlipidemia, lipid panel and liver functions stable on statin medication    Review of Systems     Objective:   Physical Exam  Chest clear. Cardiac exam regular rate and rhythm. Extremities without edema. Neck supple without JVD thyromegaly or carotid bruits. Skin warm and dry. Affect normal.      Assessment & Plan:  Type 2 diabetes mellitus-controlled. Would like to see hemoglobin A1c less than 8%  Chronic kidney disease stage III -stable and being followed   Essential hypertension-stable  Hyperlipidemia-lipid panel liver functions stable on statin medication   Plan: Increase NovoLog to 7 units before meals. Increase Lantus to 50 units at bedtime. Return in 6 weeks for office visit and hemoglobin A1c. Remind patient to  get diabetic eye exam.

## 2014-09-20 NOTE — Patient Instructions (Signed)
Increase Lantus to 50 units at bedtime. Increase NovoLog to 7 units before meals. Return in 6 weeks.

## 2014-10-28 ENCOUNTER — Other Ambulatory Visit: Payer: Medicare Other | Admitting: Internal Medicine

## 2014-10-28 DIAGNOSIS — E119 Type 2 diabetes mellitus without complications: Secondary | ICD-10-CM | POA: Diagnosis not present

## 2014-10-28 DIAGNOSIS — IMO0001 Reserved for inherently not codable concepts without codable children: Secondary | ICD-10-CM

## 2014-10-28 DIAGNOSIS — Z794 Long term (current) use of insulin: Principal | ICD-10-CM

## 2014-10-29 LAB — HEMOGLOBIN A1C
HEMOGLOBIN A1C: 7.6 % — AB (ref <5.7)
Mean Plasma Glucose: 171 mg/dL — ABNORMAL HIGH (ref <117)

## 2014-10-31 ENCOUNTER — Ambulatory Visit (INDEPENDENT_AMBULATORY_CARE_PROVIDER_SITE_OTHER): Payer: Medicare Other | Admitting: Internal Medicine

## 2014-10-31 ENCOUNTER — Encounter: Payer: Self-pay | Admitting: Internal Medicine

## 2014-10-31 VITALS — BP 138/78 | HR 96 | Temp 98.9°F | Ht 62.0 in | Wt 158.5 lb

## 2014-10-31 DIAGNOSIS — Z23 Encounter for immunization: Secondary | ICD-10-CM | POA: Diagnosis not present

## 2014-10-31 DIAGNOSIS — Z1231 Encounter for screening mammogram for malignant neoplasm of breast: Secondary | ICD-10-CM

## 2014-10-31 NOTE — Patient Instructions (Addendum)
RTC in March for six-month recheck and fasting lab work.

## 2014-10-31 NOTE — Progress Notes (Signed)
   Subjective:    Patient ID: Erin [name], female    DOB: 15-May-1945, 69 y.o.   MRN: [id number]  HPI At last visit, we  increased her Lantus insulin to 15 units at bedtime and her NovoLog was increased to 7 units before meals. Her Accu-Cheks have been very acceptable. She brings in multiple readings. They're in the low 100s for the most part. Her hemoglobin A1c has improved significantly. Blood pressure stable on current regimen although it has some lability. In August lipid panel liver functions were stable on statin medication.  Patient has appointment have diabetic eye exam October 21 with [facility].  Flu vaccine given today.     Review of Systems no new complaints     Objective:   Physical Exam   Skin: warm and dry. Nodes none. Chest clear. Cardiac exam regular rate and rhythm. Extremities without edema.       Assessment & Plan:  Controlled type 2 diabetes mellitus with hemoglobin A1c improved after changing dose of insulin. Previously hemoglobin A1c 8.6% and is now 7.6%. Am pleased with her progress.  Hyperlipidemia-stable on statin medication  History of anxiety  Chronic kidney disease stage III  Hypertension-stable   Plan: Patient will return in 6 months six-month recheck with lipid panel, liver functions, hemoglobin A1c and blood pressure check with office visit.

## 2014-11-22 DIAGNOSIS — H524 Presbyopia: Secondary | ICD-10-CM | POA: Diagnosis not present

## 2014-11-22 DIAGNOSIS — H04123 Dry eye syndrome of bilateral lacrimal glands: Secondary | ICD-10-CM | POA: Diagnosis not present

## 2014-11-22 DIAGNOSIS — H31091 Other chorioretinal scars, right eye: Secondary | ICD-10-CM | POA: Diagnosis not present

## 2014-11-22 DIAGNOSIS — E119 Type 2 diabetes mellitus without complications: Secondary | ICD-10-CM | POA: Diagnosis not present

## 2015-01-10 ENCOUNTER — Telehealth: Payer: Self-pay | Admitting: Internal Medicine

## 2015-01-10 NOTE — Telephone Encounter (Signed)
Erin [name] is calling stating that her Mom would like a refill on her Xanax.    Pharmacy:  [facility] @ [address].

## 2015-01-10 NOTE — Telephone Encounter (Signed)
Refill x 6 months 

## 2015-01-13 MED ORDER — ALPRAZOLAM 1 MG PO TABS: 1.0000 mg | ORAL_TABLET | Freq: Two times a day (BID) | ORAL | Status: DC | PRN

## 2015-01-13 NOTE — Telephone Encounter (Signed)
Called to pharmacy 

## 2015-03-13 ENCOUNTER — Other Ambulatory Visit: Payer: Self-pay | Admitting: Internal Medicine

## 2015-03-13 ENCOUNTER — Telehealth: Payer: Self-pay | Admitting: Internal Medicine

## 2015-03-13 NOTE — Telephone Encounter (Signed)
Email sent for request for refill on Lasix (by her daughter, Alesia Richards).  This has already been e-scribed to her pharmacy:  [facility] [place] on 03/12/15.

## 2015-04-04 ENCOUNTER — Other Ambulatory Visit: Payer: Self-pay

## 2015-04-04 MED ORDER — AMLODIPINE BESYLATE 10 MG PO TABS: 10.0000 mg | ORAL_TABLET | Freq: Every day | ORAL | Status: DC

## 2015-04-07 ENCOUNTER — Telehealth: Payer: Self-pay | Admitting: Internal Medicine

## 2015-04-07 NOTE — Telephone Encounter (Signed)
Called to request her [facility]ipine 10mg  [facility].  DeAnna advised this had been requested by [facility].  We will correct this in the computer and re-send the med [facility].

## 2015-04-21 ENCOUNTER — Other Ambulatory Visit: Payer: Medicare Other | Admitting: Internal Medicine

## 2015-04-21 ENCOUNTER — Other Ambulatory Visit: Payer: Self-pay | Admitting: Internal Medicine

## 2015-04-21 DIAGNOSIS — E785 Hyperlipidemia, unspecified: Secondary | ICD-10-CM | POA: Diagnosis not present

## 2015-04-21 DIAGNOSIS — E119 Type 2 diabetes mellitus without complications: Secondary | ICD-10-CM | POA: Diagnosis not present

## 2015-04-21 DIAGNOSIS — N183 Chronic kidney disease, stage 3 (moderate): Secondary | ICD-10-CM | POA: Diagnosis not present

## 2015-04-21 DIAGNOSIS — Z79899 Other long term (current) drug therapy: Secondary | ICD-10-CM

## 2015-04-21 LAB — LIPID PANEL
CHOL/HDL RATIO: 3.4 ratio (ref <=5.0)
CHOLESTEROL: 118 mg/dL — AB (ref 125–200)
HDL: 35 mg/dL — ABNORMAL LOW (ref >=46)
LDL Cholesterol: 55 mg/dL (ref <130)
Triglycerides: 142 mg/dL (ref <150)
VLDL: 28 mg/dL (ref <30)

## 2015-04-21 LAB — HEPATIC FUNCTION PANEL
ALT: 19 U/L (ref 6–29)
AST: 16 U/L (ref 10–35)
Albumin: 4.4 g/dL (ref 3.6–5.1)
Alkaline Phosphatase: 99 U/L (ref 33–130)
BILIRUBIN DIRECT: 0.1 mg/dL (ref <=0.2)
Indirect Bilirubin: 0.6 mg/dL (ref 0.2–1.2)
Total Bilirubin: 0.7 mg/dL (ref 0.2–1.2)
Total Protein: 6.8 g/dL (ref 6.1–8.1)

## 2015-04-21 LAB — HEMOGLOBIN A1C
HEMOGLOBIN A1C: 8.2 % — AB (ref <5.7)
MEAN PLASMA GLUCOSE: 189 mg/dL — AB (ref <117)

## 2015-04-25 ENCOUNTER — Encounter: Payer: Self-pay | Admitting: Internal Medicine

## 2015-04-25 ENCOUNTER — Ambulatory Visit (INDEPENDENT_AMBULATORY_CARE_PROVIDER_SITE_OTHER): Payer: Medicare Other | Admitting: Internal Medicine

## 2015-04-25 VITALS — BP 138/80 | HR 90 | Temp 97.6°F | Resp 20 | Ht 62.0 in | Wt 164.0 lb

## 2015-04-25 DIAGNOSIS — N183 Chronic kidney disease, stage 3 unspecified: Secondary | ICD-10-CM

## 2015-04-25 DIAGNOSIS — F411 Generalized anxiety disorder: Secondary | ICD-10-CM

## 2015-04-25 DIAGNOSIS — F4321 Adjustment disorder with depressed mood: Secondary | ICD-10-CM | POA: Diagnosis not present

## 2015-04-25 DIAGNOSIS — Z23 Encounter for immunization: Secondary | ICD-10-CM

## 2015-04-25 DIAGNOSIS — R030 Elevated blood-pressure reading, without diagnosis of hypertension: Secondary | ICD-10-CM

## 2015-04-25 DIAGNOSIS — Z794 Long term (current) use of insulin: Secondary | ICD-10-CM

## 2015-04-25 DIAGNOSIS — I10 Essential (primary) hypertension: Secondary | ICD-10-CM | POA: Diagnosis not present

## 2015-04-25 DIAGNOSIS — E785 Hyperlipidemia, unspecified: Secondary | ICD-10-CM | POA: Diagnosis not present

## 2015-04-25 DIAGNOSIS — E119 Type 2 diabetes mellitus without complications: Secondary | ICD-10-CM

## 2015-04-25 DIAGNOSIS — IMO0001 Reserved for inherently not codable concepts without codable children: Secondary | ICD-10-CM

## 2015-04-25 MED ORDER — PNEUMOCOCCAL 13-VAL CONJ VACC IM SUSP
0.5000 mL | Freq: Once | INTRAMUSCULAR | Status: AC
  Administered 2015-04-25: 0.5 mL via INTRAMUSCULAR

## 2015-04-25 NOTE — Progress Notes (Signed)
   Subjective:    Patient ID: Erin [name], female    DOB: 07-25-1945, 70 y.o.   MRN: [id number]  HPI 70 year old Black Female with history of hypertension, hyperlipidemia, type 2 diabetes mellitus, anxiety, chronic kidney disease, office hypertension for six-month recheck. Has not had mammogram in 2 years. Order given for her to call the breast center. Had diabetic eye exam October 2016 which she indicates was normal. Hasn't been working as hard on diet recently. Has grief reaction from death of mother a year ago. Husband died April 29 a number of years ago from a heart attack, and she has been thinking about that already. She has anniversary grief reactions. Has Xanax for anxiety.  Blood pressure was initially elevated at 160/72 with pulse of 105 but improved after resting and talking with her. Blood pressure recheck was 138/80. Pulse 90.  Lipid panel is within normal limits. Liver functions normal. Unfortunately hemoglobin A1c is increased 8.2% and previously 6 months ago was 7.6%.   Review of Systems no new complaints     Objective:   Physical Exam Skin warm and dry. Nodes none. Chest clear to auscultation without rales or wheezing. Cardiac exam regular rate and rhythm normal S1 and S2. Extremities without edema.       Assessment & Plan:  Controlled type 2 diabetes mellitus-insulin-dependent. A1c has increased from 6 months ago from 7.6% 8.2%. Encouraged diet and exercise.  Hyperlipidemia-has a low HDL cholesterol. Remains on statin therapy  Essential hypertension-has labile component but stable  Anxiety-continue Xanax  Chronic kidney disease, basic metabolic panel pending  Plan: Or given for mammogram. Return in 6 months for physical examination. Continue same medications.

## 2015-04-25 NOTE — Patient Instructions (Signed)
Continue same medications and return in 6 months. Watch diet and try to exercise. Prevnar 13 given today. Have mammogram.

## 2015-04-26 LAB — BASIC METABOLIC PANEL
BUN: 25 mg/dL (ref 7–25)
CHLORIDE: 103 mmol/L (ref 98–110)
CO2: 24 mmol/L (ref 20–31)
Calcium: 9.6 mg/dL (ref 8.6–10.4)
Creat: 1.81 mg/dL — ABNORMAL HIGH (ref 0.60–0.93)
Glucose, Bld: 154 mg/dL — ABNORMAL HIGH (ref 65–99)
POTASSIUM: 5.1 mmol/L (ref 3.5–5.3)
Sodium: 140 mmol/L (ref 135–146)

## 2015-04-26 LAB — MICROALBUMIN, URINE

## 2015-10-10 ENCOUNTER — Other Ambulatory Visit: Payer: Medicare Other | Admitting: Internal Medicine

## 2015-10-10 DIAGNOSIS — E119 Type 2 diabetes mellitus without complications: Secondary | ICD-10-CM | POA: Diagnosis not present

## 2015-10-10 DIAGNOSIS — I10 Essential (primary) hypertension: Secondary | ICD-10-CM | POA: Diagnosis not present

## 2015-10-10 DIAGNOSIS — E8881 Metabolic syndrome: Secondary | ICD-10-CM | POA: Diagnosis not present

## 2015-10-10 DIAGNOSIS — E785 Hyperlipidemia, unspecified: Secondary | ICD-10-CM

## 2015-10-10 DIAGNOSIS — Z794 Long term (current) use of insulin: Secondary | ICD-10-CM

## 2015-10-10 DIAGNOSIS — IMO0001 Reserved for inherently not codable concepts without codable children: Secondary | ICD-10-CM

## 2015-10-10 DIAGNOSIS — N183 Chronic kidney disease, stage 3 unspecified: Secondary | ICD-10-CM

## 2015-10-10 LAB — CBC WITH DIFFERENTIAL/PLATELET
BASOS PCT: 0 %
Basophils Absolute: 0 cells/uL (ref 0–200)
Eosinophils Absolute: 0 cells/uL — ABNORMAL LOW (ref 15–500)
Eosinophils Relative: 0 %
HCT: 40.4 % (ref 35.0–45.0)
Hemoglobin: 12.7 g/dL (ref 11.7–15.5)
LYMPHS PCT: 30 %
Lymphs Abs: 1710 cells/uL (ref 850–3900)
MCH: 24.8 pg — ABNORMAL LOW (ref 27.0–33.0)
MCHC: 31.4 g/dL — AB (ref 32.0–36.0)
MCV: 78.8 fL — AB (ref 80.0–100.0)
MONOS PCT: 10 %
MPV: 10.3 fL (ref 7.5–12.5)
Monocytes Absolute: 570 cells/uL (ref 200–950)
NEUTROS PCT: 60 %
Neutro Abs: 3420 cells/uL (ref 1500–7800)
PLATELETS: 331 10*3/uL (ref 140–400)
RBC: 5.13 MIL/uL — AB (ref 3.80–5.10)
RDW: 15.7 % — AB (ref 11.0–15.0)
WBC: 5.7 10*3/uL (ref 3.8–10.8)

## 2015-10-10 LAB — COMPLETE METABOLIC PANEL WITH GFR
ALT: 23 U/L (ref 6–29)
AST: 20 U/L (ref 10–35)
Albumin: 4.5 g/dL (ref 3.6–5.1)
Alkaline Phosphatase: 88 U/L (ref 33–130)
BUN: 20 mg/dL (ref 7–25)
CHLORIDE: 104 mmol/L (ref 98–110)
CO2: 25 mmol/L (ref 20–31)
CREATININE: 1.69 mg/dL — AB (ref 0.60–0.93)
Calcium: 10 mg/dL (ref 8.6–10.4)
GFR, EST AFRICAN AMERICAN: 35 mL/min — AB (ref >=60)
GFR, Est Non African American: 30 mL/min — ABNORMAL LOW (ref >=60)
Glucose, Bld: 140 mg/dL — ABNORMAL HIGH (ref 65–99)
POTASSIUM: 4.7 mmol/L (ref 3.5–5.3)
Sodium: 141 mmol/L (ref 135–146)
Total Bilirubin: 0.9 mg/dL (ref 0.2–1.2)
Total Protein: 6.9 g/dL (ref 6.1–8.1)

## 2015-10-10 LAB — LIPID PANEL
CHOLESTEROL: 115 mg/dL — AB (ref 125–200)
HDL: 42 mg/dL — AB (ref >=46)
LDL Cholesterol: 50 mg/dL (ref <130)
TRIGLYCERIDES: 116 mg/dL (ref <150)
Total CHOL/HDL Ratio: 2.7 Ratio (ref <=5.0)
VLDL: 23 mg/dL (ref <30)

## 2015-10-10 LAB — TSH: TSH: 1.87 m[IU]/L

## 2015-10-11 LAB — HEMOGLOBIN A1C
Hgb A1c MFr Bld: 7.5 % — ABNORMAL HIGH (ref <5.7)
Mean Plasma Glucose: 169 mg/dL

## 2015-10-11 LAB — VITAMIN D 25 HYDROXY (VIT D DEFICIENCY, FRACTURES): VIT D 25 HYDROXY: 20 ng/mL — AB (ref 30–100)

## 2015-10-13 ENCOUNTER — Ambulatory Visit (INDEPENDENT_AMBULATORY_CARE_PROVIDER_SITE_OTHER): Payer: Medicare Other | Admitting: Internal Medicine

## 2015-10-13 ENCOUNTER — Other Ambulatory Visit: Payer: Self-pay

## 2015-10-13 ENCOUNTER — Encounter: Payer: Self-pay | Admitting: Internal Medicine

## 2015-10-13 VITALS — BP 140/80 | HR 99 | Temp 98.5°F | Ht 62.0 in | Wt 159.0 lb

## 2015-10-13 DIAGNOSIS — Z23 Encounter for immunization: Secondary | ICD-10-CM

## 2015-10-13 DIAGNOSIS — E785 Hyperlipidemia, unspecified: Secondary | ICD-10-CM | POA: Diagnosis not present

## 2015-10-13 DIAGNOSIS — Z794 Long term (current) use of insulin: Secondary | ICD-10-CM | POA: Diagnosis not present

## 2015-10-13 DIAGNOSIS — N183 Chronic kidney disease, stage 3 unspecified: Secondary | ICD-10-CM

## 2015-10-13 DIAGNOSIS — Z0000 Encounter for general adult medical examination without abnormal findings: Secondary | ICD-10-CM | POA: Diagnosis not present

## 2015-10-13 DIAGNOSIS — E119 Type 2 diabetes mellitus without complications: Secondary | ICD-10-CM | POA: Diagnosis not present

## 2015-10-13 DIAGNOSIS — Z1239 Encounter for other screening for malignant neoplasm of breast: Secondary | ICD-10-CM

## 2015-10-13 DIAGNOSIS — I10 Essential (primary) hypertension: Secondary | ICD-10-CM

## 2015-10-13 DIAGNOSIS — F411 Generalized anxiety disorder: Secondary | ICD-10-CM | POA: Diagnosis not present

## 2015-10-13 DIAGNOSIS — R8290 Unspecified abnormal findings in urine: Secondary | ICD-10-CM | POA: Diagnosis not present

## 2015-10-13 DIAGNOSIS — E8881 Metabolic syndrome: Secondary | ICD-10-CM

## 2015-10-13 DIAGNOSIS — IMO0001 Reserved for inherently not codable concepts without codable children: Secondary | ICD-10-CM

## 2015-10-13 LAB — POCT URINALYSIS DIPSTICK
BILIRUBIN UA: NEGATIVE
Glucose, UA: NEGATIVE
KETONES UA: NEGATIVE
NITRITE UA: NEGATIVE
PH UA: 6
Protein, UA: NEGATIVE
RBC UA: NEGATIVE
Spec Grav, UA: <=1.005
Urobilinogen, UA: 0.2

## 2015-10-13 LAB — MICROALBUMIN / CREATININE URINE RATIO

## 2015-10-13 MED ORDER — ALPRAZOLAM 1 MG PO TABS: 1.0000 mg | ORAL_TABLET | Freq: Two times a day (BID) | ORAL | 5 refills | Status: DC | PRN

## 2015-10-13 MED ORDER — ROSUVASTATIN CALCIUM 20 MG PO TABS: 20.0000 mg | ORAL_TABLET | Freq: Every day | ORAL | 5 refills | Status: DC
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
## 2015-10-14 LAB — MICROALBUMIN / CREATININE URINE RATIO
CREATININE, URINE: 33 mg/dL (ref 20–320)
MICROALB UR: 0.2 mg/dL
MICROALB/CREAT RATIO: 6 ug/mg{creat} (ref <30)

## 2015-10-14 LAB — URINE CULTURE: Organism ID, Bacteria: NO GROWTH

## 2015-10-21 ENCOUNTER — Telehealth: Payer: Self-pay | Admitting: *Deleted

## 2015-10-21 NOTE — Telephone Encounter (Signed)
Refill Authorization for Lantus and Novolog completed and faxed back by Dr. Lenord FellersBaxley on 10/21/15

## 2015-10-22 ENCOUNTER — Other Ambulatory Visit: Payer: Self-pay | Admitting: Internal Medicine

## 2015-11-01 NOTE — Patient Instructions (Signed)
It was pleasure to see you today. Continue same medications and return in 6 months.  Addendum: Urine culture no growth

## 2016-01-02 ENCOUNTER — Telehealth: Payer: Self-pay | Admitting: Internal Medicine

## 2016-01-02 NOTE — Telephone Encounter (Signed)
Left message for Kathie RhodesBetty to schedule diabetic eye exam.  Spoke with [name] and she will work on getting the appointment.

## 2016-01-08 ENCOUNTER — Other Ambulatory Visit: Payer: Self-pay | Admitting: Internal Medicine

## 2016-01-08 MED ORDER — OLMESARTAN MEDOXOMIL 40 MG PO TABS: 40.0000 mg | ORAL_TABLET | Freq: Every day | ORAL | 3 refills | Status: DC

## 2016-04-13 ENCOUNTER — Other Ambulatory Visit: Payer: Medicare Other | Admitting: Internal Medicine

## 2016-04-15 ENCOUNTER — Ambulatory Visit: Payer: Medicare Other | Admitting: Internal Medicine

## 2016-04-20 ENCOUNTER — Other Ambulatory Visit: Payer: Medicare Other | Admitting: Internal Medicine

## 2016-04-20 DIAGNOSIS — Z794 Long term (current) use of insulin: Secondary | ICD-10-CM

## 2016-04-20 DIAGNOSIS — I10 Essential (primary) hypertension: Secondary | ICD-10-CM

## 2016-04-20 DIAGNOSIS — E119 Type 2 diabetes mellitus without complications: Secondary | ICD-10-CM

## 2016-04-20 DIAGNOSIS — Z79899 Other long term (current) drug therapy: Secondary | ICD-10-CM

## 2016-04-20 DIAGNOSIS — IMO0001 Reserved for inherently not codable concepts without codable children: Secondary | ICD-10-CM

## 2016-04-20 DIAGNOSIS — E785 Hyperlipidemia, unspecified: Secondary | ICD-10-CM

## 2016-04-20 LAB — HEPATIC FUNCTION PANEL
ALBUMIN: 4.3 g/dL (ref 3.6–5.1)
ALK PHOS: 84 U/L (ref 33–130)
ALT: 21 U/L (ref 6–29)
AST: 16 U/L (ref 10–35)
BILIRUBIN DIRECT: 0.1 mg/dL (ref <=0.2)
Indirect Bilirubin: 0.5 mg/dL (ref 0.2–1.2)
TOTAL PROTEIN: 6.6 g/dL (ref 6.1–8.1)
Total Bilirubin: 0.6 mg/dL (ref 0.2–1.2)

## 2016-04-20 LAB — BASIC METABOLIC PANEL
BUN: 24 mg/dL (ref 7–25)
CALCIUM: 9.7 mg/dL (ref 8.6–10.4)
CO2: 25 mmol/L (ref 20–31)
CREATININE: 1.88 mg/dL — AB (ref 0.60–0.93)
Chloride: 103 mmol/L (ref 98–110)
GLUCOSE: 139 mg/dL — AB (ref 65–99)
POTASSIUM: 4.7 mmol/L (ref 3.5–5.3)
Sodium: 138 mmol/L (ref 135–146)

## 2016-04-20 LAB — LIPID PANEL
CHOL/HDL RATIO: 6.6 ratio — AB (ref <5.0)
CHOLESTEROL: 244 mg/dL — AB (ref <200)
HDL: 37 mg/dL — ABNORMAL LOW (ref >50)
LDL Cholesterol: 167 mg/dL — ABNORMAL HIGH (ref <100)
TRIGLYCERIDES: 199 mg/dL — AB (ref <150)
VLDL: 40 mg/dL — ABNORMAL HIGH (ref <30)

## 2016-04-21 LAB — HEMOGLOBIN A1C
HEMOGLOBIN A1C: 7.4 % — AB (ref <5.7)
Mean Plasma Glucose: 166 mg/dL

## 2016-04-21 LAB — MICROALBUMIN / CREATININE URINE RATIO

## 2016-04-23 ENCOUNTER — Encounter: Payer: Self-pay | Admitting: Internal Medicine

## 2016-04-23 ENCOUNTER — Other Ambulatory Visit: Payer: Self-pay | Admitting: Internal Medicine

## 2016-04-23 ENCOUNTER — Ambulatory Visit (INDEPENDENT_AMBULATORY_CARE_PROVIDER_SITE_OTHER): Payer: Medicare Other | Admitting: Internal Medicine

## 2016-04-23 VITALS — BP 140/70 | HR 92 | Temp 98.3°F | Ht 62.0 in | Wt 166.0 lb

## 2016-04-23 DIAGNOSIS — E784 Other hyperlipidemia: Secondary | ICD-10-CM | POA: Diagnosis not present

## 2016-04-23 DIAGNOSIS — N183 Chronic kidney disease, stage 3 unspecified: Secondary | ICD-10-CM

## 2016-04-23 DIAGNOSIS — I10 Essential (primary) hypertension: Secondary | ICD-10-CM | POA: Diagnosis not present

## 2016-04-23 DIAGNOSIS — F411 Generalized anxiety disorder: Secondary | ICD-10-CM | POA: Diagnosis not present

## 2016-04-23 DIAGNOSIS — E8881 Metabolic syndrome: Secondary | ICD-10-CM | POA: Diagnosis not present

## 2016-04-23 DIAGNOSIS — E119 Type 2 diabetes mellitus without complications: Secondary | ICD-10-CM | POA: Diagnosis not present

## 2016-04-23 DIAGNOSIS — E7849 Other hyperlipidemia: Secondary | ICD-10-CM

## 2016-04-23 MED ORDER — ATORVASTATIN CALCIUM 40 MG PO TABS: 40.0000 mg | ORAL_TABLET | Freq: Every day | ORAL | 5 refills | Status: DC

## 2016-04-23 MED ORDER — ALPRAZOLAM 1 MG PO TABS: 1.0000 mg | ORAL_TABLET | Freq: Two times a day (BID) | ORAL | 5 refills | Status: DC | PRN

## 2016-04-23 NOTE — Telephone Encounter (Signed)
Refill x 6 months 

## 2016-04-23 NOTE — Telephone Encounter (Signed)
RX WAS CALLED IN BY MICHELLE.
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
## 2016-04-24 LAB — MICROALBUMIN / CREATININE URINE RATIO
CREATININE, URINE: 28 mg/dL (ref 20–320)
Microalb, Ur: <0.2 mg/dL

## 2016-04-30 NOTE — Patient Instructions (Signed)
It was a pleasure to see you today. We will change her to another statin medication due to cost with follow-up in a few weeks. Physical exam due in 6 months. Please have diabetic eye exam.

## 2016-06-04 ENCOUNTER — Other Ambulatory Visit: Payer: Self-pay | Admitting: Internal Medicine

## 2016-09-14 DIAGNOSIS — E119 Type 2 diabetes mellitus without complications: Secondary | ICD-10-CM | POA: Diagnosis not present

## 2016-09-14 DIAGNOSIS — H04123 Dry eye syndrome of bilateral lacrimal glands: Secondary | ICD-10-CM | POA: Diagnosis not present

## 2016-09-14 DIAGNOSIS — H40013 Open angle with borderline findings, low risk, bilateral: Secondary | ICD-10-CM | POA: Diagnosis not present

## 2016-09-14 DIAGNOSIS — H52222 Regular astigmatism, left eye: Secondary | ICD-10-CM | POA: Diagnosis not present

## 2016-09-14 DIAGNOSIS — H524 Presbyopia: Secondary | ICD-10-CM | POA: Diagnosis not present

## 2016-09-14 DIAGNOSIS — H31091 Other chorioretinal scars, right eye: Secondary | ICD-10-CM | POA: Diagnosis not present

## 2016-09-14 DIAGNOSIS — H1820 Unspecified corneal edema: Secondary | ICD-10-CM | POA: Diagnosis not present

## 2016-09-14 LAB — HM DIABETES EYE EXAM

## 2016-10-19 ENCOUNTER — Other Ambulatory Visit: Payer: Medicare Other | Admitting: Internal Medicine

## 2016-10-19 DIAGNOSIS — E785 Hyperlipidemia, unspecified: Secondary | ICD-10-CM | POA: Diagnosis not present

## 2016-10-19 DIAGNOSIS — I10 Essential (primary) hypertension: Secondary | ICD-10-CM | POA: Diagnosis not present

## 2016-10-19 DIAGNOSIS — Z794 Long term (current) use of insulin: Secondary | ICD-10-CM

## 2016-10-19 DIAGNOSIS — F419 Anxiety disorder, unspecified: Secondary | ICD-10-CM

## 2016-10-19 DIAGNOSIS — N183 Chronic kidney disease, stage 3 unspecified: Secondary | ICD-10-CM

## 2016-10-19 DIAGNOSIS — E119 Type 2 diabetes mellitus without complications: Secondary | ICD-10-CM

## 2016-10-19 DIAGNOSIS — IMO0001 Reserved for inherently not codable concepts without codable children: Secondary | ICD-10-CM

## 2016-10-19 DIAGNOSIS — E8881 Metabolic syndrome: Secondary | ICD-10-CM

## 2016-10-19 DIAGNOSIS — Z0000 Encounter for general adult medical examination without abnormal findings: Secondary | ICD-10-CM

## 2016-10-20 LAB — COMPLETE METABOLIC PANEL WITH GFR
AG RATIO: 2.1 (calc) (ref 1.0–2.5)
ALT: 20 U/L (ref 6–29)
AST: 17 U/L (ref 10–35)
Albumin: 4.6 g/dL (ref 3.6–5.1)
Alkaline phosphatase (APISO): 105 U/L (ref 33–130)
BILIRUBIN TOTAL: 0.7 mg/dL (ref 0.2–1.2)
BUN/Creatinine Ratio: 11 (calc) (ref 6–22)
BUN: 19 mg/dL (ref 7–25)
CALCIUM: 9.6 mg/dL (ref 8.6–10.4)
CHLORIDE: 103 mmol/L (ref 98–110)
CO2: 23 mmol/L (ref 20–32)
Creat: 1.73 mg/dL — ABNORMAL HIGH (ref 0.60–0.93)
GFR, EST AFRICAN AMERICAN: 34 mL/min/{1.73_m2} — AB (ref >=60)
GFR, EST NON AFRICAN AMERICAN: 29 mL/min/{1.73_m2} — AB (ref >=60)
GLOBULIN: 2.2 g/dL (ref 1.9–3.7)
Glucose, Bld: 114 mg/dL — ABNORMAL HIGH (ref 65–99)
POTASSIUM: 4.2 mmol/L (ref 3.5–5.3)
Sodium: 140 mmol/L (ref 135–146)
Total Protein: 6.8 g/dL (ref 6.1–8.1)

## 2016-10-20 LAB — CBC WITH DIFFERENTIAL/PLATELET
BASOS ABS: 30 {cells}/uL (ref 0–200)
Basophils Relative: 0.5 %
EOS ABS: 18 {cells}/uL (ref 15–500)
Eosinophils Relative: 0.3 %
HCT: 40.8 % (ref 35.0–45.0)
HEMOGLOBIN: 12.9 g/dL (ref 11.7–15.5)
Lymphs Abs: 1711 cells/uL (ref 850–3900)
MCH: 24.7 pg — AB (ref 27.0–33.0)
MCHC: 31.6 g/dL — ABNORMAL LOW (ref 32.0–36.0)
MCV: 78.2 fL — AB (ref 80.0–100.0)
MPV: 11.4 fL (ref 7.5–12.5)
Monocytes Relative: 10.1 %
NEUTROS ABS: 3546 {cells}/uL (ref 1500–7800)
Neutrophils Relative %: 60.1 %
PLATELETS: 316 10*3/uL (ref 140–400)
RBC: 5.22 10*6/uL — ABNORMAL HIGH (ref 3.80–5.10)
RDW: 14.6 % (ref 11.0–15.0)
Total Lymphocyte: 29 %
WBC: 5.9 10*3/uL (ref 3.8–10.8)
WBCMIX: 596 {cells}/uL (ref 200–950)

## 2016-10-20 LAB — HEMOGLOBIN A1C
EAG (MMOL/L): 9.2 (calc)
Hgb A1c MFr Bld: 7.4 % of total Hgb — ABNORMAL HIGH (ref <5.7)
MEAN PLASMA GLUCOSE: 166 (calc)

## 2016-10-20 LAB — MICROALBUMIN / CREATININE URINE RATIO
CREATININE, URINE: 150 mg/dL (ref 20–275)
MICROALB/CREAT RATIO: 6 ug/mg{creat} (ref <30)
Microalb, Ur: 0.9 mg/dL

## 2016-10-20 LAB — LIPID PANEL
Cholesterol: 136 mg/dL (ref <200)
HDL: 44 mg/dL — AB (ref >50)
LDL Cholesterol (Calc): 72 mg/dL (calc)
NON-HDL CHOLESTEROL (CALC): 92 mg/dL (ref <130)
TRIGLYCERIDES: 112 mg/dL (ref <150)
Total CHOL/HDL Ratio: 3.1 (calc) (ref <5.0)

## 2016-10-20 LAB — TSH: TSH: 1.66 m[IU]/L (ref 0.40–4.50)

## 2016-10-22 ENCOUNTER — Encounter: Payer: Self-pay | Admitting: Internal Medicine

## 2016-10-22 ENCOUNTER — Ambulatory Visit (INDEPENDENT_AMBULATORY_CARE_PROVIDER_SITE_OTHER): Payer: Medicare Other | Admitting: Internal Medicine

## 2016-10-22 VITALS — BP 146/80 | HR 104 | Temp 99.2°F | Ht 61.0 in | Wt 164.0 lb

## 2016-10-22 DIAGNOSIS — Z0000 Encounter for general adult medical examination without abnormal findings: Secondary | ICD-10-CM | POA: Diagnosis not present

## 2016-10-22 DIAGNOSIS — E8881 Metabolic syndrome: Secondary | ICD-10-CM

## 2016-10-22 DIAGNOSIS — N183 Chronic kidney disease, stage 3 unspecified: Secondary | ICD-10-CM

## 2016-10-22 DIAGNOSIS — E7849 Other hyperlipidemia: Secondary | ICD-10-CM

## 2016-10-22 DIAGNOSIS — Z23 Encounter for immunization: Secondary | ICD-10-CM | POA: Diagnosis not present

## 2016-10-22 DIAGNOSIS — IMO0001 Reserved for inherently not codable concepts without codable children: Secondary | ICD-10-CM

## 2016-10-22 DIAGNOSIS — Z794 Long term (current) use of insulin: Secondary | ICD-10-CM | POA: Diagnosis not present

## 2016-10-22 DIAGNOSIS — E784 Other hyperlipidemia: Secondary | ICD-10-CM | POA: Diagnosis not present

## 2016-10-22 DIAGNOSIS — F419 Anxiety disorder, unspecified: Secondary | ICD-10-CM | POA: Diagnosis not present

## 2016-10-22 DIAGNOSIS — I10 Essential (primary) hypertension: Secondary | ICD-10-CM

## 2016-10-22 DIAGNOSIS — E119 Type 2 diabetes mellitus without complications: Secondary | ICD-10-CM

## 2016-10-22 LAB — POCT URINALYSIS DIPSTICK
BILIRUBIN UA: NEGATIVE
GLUCOSE UA: NEGATIVE
KETONES UA: NEGATIVE
Leukocytes, UA: NEGATIVE
Nitrite, UA: NEGATIVE
Protein, UA: NEGATIVE
RBC UA: NEGATIVE
SPEC GRAV UA: 1.015 (ref 1.010–1.025)
Urobilinogen, UA: 0.2 E.U./dL
pH, UA: 5 (ref 5.0–8.0)

## 2016-10-30 NOTE — Progress Notes (Signed)
Subjective:    Patient ID: Erin [name], female    DOB: 1945/03/08, 71 y.o.   MRN: [id number]  HPI 71 year old Black Female for Medicare wellness, routine health maintenance, and evaluation of medical issues. Patient has a history of insulin-dependent diabetes mellitus, hypertension, hyperlipidemia, chronic kidney disease and anxiety.  She was admitted to the hospital in 06/20/2008 with renal failure. She was in diabetic ketoacidosis with hyperosmolar state. Serum glucose was 1212. She was dehydrated and serum creatinine was 4.90. Creatinine never returned to normal after that.  Reminded once again about annual diabetic eye exam.  Social history: She has 3 children. She and her husband were married for 25 years. He died of a massive MI in 2004-06-20. Patient was born in [place] and grew up in [place]. 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 Does not smoke or consume alcohol. She drives.   Family history: Father died in his 52s of diabetes. One brother and one sister who are healthy. Mother with history of breast cancer and diabetes died from multiorgan failure/sepsis. One daughter with hypertension. Another daughter in good health.      Review of Systems History of anxiety. Fatigue. Still enjoys cooking.     Objective:   Physical Exam  Constitutional: She is oriented to person, place, and time. She appears well-developed and well-nourished. No distress.  HENT:  Head: Normocephalic and atraumatic.  Right Ear: External ear normal.  Left Ear: External ear normal.  Mouth/Throat: Oropharynx is clear and moist.  Eyes: Pupils are equal, round, and reactive to light. Conjunctivae and EOM are normal. Right eye exhibits no discharge. Left eye exhibits no discharge. No scleral icterus.  Neck: Neck supple. No JVD present. No  thyromegaly present.  Cardiovascular: Normal rate, regular rhythm, normal heart sounds and intact distal pulses.   No murmur heard. Pulmonary/Chest: Effort normal and breath sounds normal. No respiratory distress. She has no wheezes. She has no rales.  Abdominal: Soft. Bowel sounds are normal. She exhibits no distension and no mass. There is no tenderness. There is no rebound and no guarding.  Genitourinary:  Genitourinary Comments: Pap deferred. Bimanual normal.  Musculoskeletal: She exhibits no edema.  Lymphadenopathy:    She has no cervical adenopathy.  Neurological: She is alert and oriented to person, place, and time. She has normal reflexes. No cranial nerve deficit. Coordination normal.  Skin: Skin is warm and dry. No rash noted. She is not diaphoretic.  Psychiatric: She has a normal mood and affect. Her behavior is normal. Judgment and thought content normal.  Vitals reviewed.         Assessment & Plan:  Insulin-dependent diabetes mellitus-hemoglobin A1c 7.4%. In March 2017 it was 8.2%. Continue to watch diet. No changes made in medication.  Essential hypertension-stable on medication. She is anxious today. Pulse is 104 and blood pressure is elevated at 146/80 think due to anxiety and labile hypertension. No changes made.  Hyperlipidemia-stable on statin  Anxiety-treated with anti-anxiety medication  Insomnia  Chronic kidney disease-creatinine is 1.73 and 6 months ago 1.88  Plan: Medical issues are stable. Continue same medications and return in 6 months. Flu vaccine given.  Subjective:   Patient presents for Medicare Annual/Subsequent preventive examination.  Review Past Medical/Family/Social:See above   Risk Factors  Current exercise habits: Mostly sedentary-light housework Dietary issues discussed: Low  fat low carbohydrate  Cardiac risk factors:Diabetes, hyperlipidemia  Depression Screen  (Note: if answer to either of the following is "Yes", a more complete  depression screening is indicated)   Over the past two weeks, have you felt down, depressed or hopeless? No  Over the past two weeks, have you felt little interest or pleasure in doing things? No Have you lost interest or pleasure in daily life? No Do you often feel hopeless? No Do you cry easily over simple problems? Yes frequently anxious  Activities of Daily Living  In your present state of health, do you have any difficulty performing the following activities?:   Driving? No  Managing money? No  Feeding yourself? No  Getting from bed to chair? No  Climbing a flight of stairs? No  Preparing food and eating?: No  Bathing or showering? No  Getting dressed: No  Getting to the toilet? No  Using the toilet:No  Moving around from place to place: No  In the past year have you fallen or had a near fall?:No  Are you sexually active? No  Do you have more than one partner? No   Hearing Difficulties: No  Do you often ask people to speak up or repeat themselves? No  Do you experience ringing or noises in your ears? No  Do you have difficulty understanding soft or whispered voices? No  Do you feel that you have a problem with memory? No Do you often misplace items? No    Home Safety:  Do you have a smoke alarm at your residence? Yes Do you have grab bars in the bathroom?Yes Do you have throw rugs in your house? Yes   Cognitive Testing  Alert? Yes Normal Appearance?Yes  Oriented to person? Yes Place? Yes  Time? Yes  Recall of three objects? Yes  Can perform simple calculations? Yes  Displays appropriate judgment?Yes  Can read the correct time from a watch face?Yes   List the Names of Other Physician/Practitioners you currently use:  See referral list for the physicians patient is currently seeing.     Review of Systems: See above   Objective:     General appearance: Appears stated age and mildly obese  Head: Normocephalic, without obvious abnormality, atraumatic    Eyes: conj clear, EOMi PEERLA  Ears: normal TM's and external ear canals both ears  Nose: Nares normal. Septum midline. Mucosa normal. No drainage or sinus tenderness.  Throat: lips, mucosa, and tongue normal; teeth and gums normal  Neck: no adenopathy, no carotid bruit, no JVD, supple, symmetrical, trachea midline and thyroid not enlarged, symmetric, no tenderness/mass/nodules  No CVA tenderness.  Lungs: clear to auscultation bilaterally  Breasts: normal appearance, no masses or tenderness Heart: regular rate and rhythm, S1, S2 normal, no murmur, click, rub or gallop  Abdomen: soft, non-tender; bowel sounds normal; no masses, no organomegaly  Musculoskeletal: ROM normal in all joints, no crepitus, no deformity, Normal muscle strengthen. Back  is symmetric, no curvature. Skin: Skin color, texture, turgor normal. No rashes or lesions  Lymph nodes: Cervical, supraclavicular, and axillary nodes normal.  Neurologic: CN 2 -12 Normal, Normal symmetric reflexes. Normal coordination and gait  Psych: Alert & Oriented x 3, Mood appear stable.    Assessment:    Annual wellness medicare exam   Plan:    During the course of the visit the patient was educated and counseled about appropriate screening and preventive services including:  Order written for mammogram-if cannot afford or insurance cannot cover she  is to let me know so we can get her a scholarship  Recommend annual flu vaccine  Recommend annual diabetic eye exam-last year expense was an issue      Patient Instructions (the written plan) was given to the patient.  Medicare Attestation  I have personally reviewed:  The patient's medical and social history  Their use of alcohol, tobacco or illicit drugs  Their current medications and supplements  The patient's functional ability including ADLs,fall risks, home safety risks, cognitive, and hearing and visual impairment  Diet and physical activities  Evidence for depression or  mood disorders  The patient's weight, height, BMI, and visual acuity have been recorded in the chart. I have made referrals, counseling, and provided education to the patient based on review of the above and I have provided the patient with a written personalized care plan for preventive services.

## 2016-10-31 NOTE — Patient Instructions (Addendum)
It was a pleasure to see you today. Lab work is stable. Continue same medications. Continue to watch her diet. Return in 6 months. Flu vaccine given

## 2016-11-08 ENCOUNTER — Telehealth: Payer: Self-pay

## 2016-11-08 MED ORDER — INSULIN ASPART 100 UNIT/ML ~~LOC~~ SOLN: 7.0000 [IU] | Freq: Three times a day (TID) | SUBCUTANEOUS | 11 refills | Status: DC

## 2016-11-08 MED ORDER — INSULIN GLARGINE 100 UNIT/ML SOLOSTAR PEN: 50.0000 [IU] | PEN_INJECTOR | Freq: Every day | SUBCUTANEOUS | 11 refills | Status: DC

## 2016-11-08 NOTE — Telephone Encounter (Signed)
Received fax from [facility] Dept in regards to a refill on Lantus and Novolog for patient. Medication was refilled per Dr. Beryle Quant request. Sent 1 yr

## 2016-11-10 ENCOUNTER — Telehealth: Payer: Self-pay

## 2016-11-10 MED ORDER — ATORVASTATIN CALCIUM 40 MG PO TABS: 40.0000 mg | ORAL_TABLET | Freq: Every day | ORAL | 11 refills | Status: DC

## 2016-11-10 NOTE — Telephone Encounter (Signed)
Received fax from [facility] Department in regards to a refill on Atorvastatin  for patient. Medication was refilled per Dr. Beryle Quant request. Sent 1 yr by fax

## 2016-11-29 ENCOUNTER — Other Ambulatory Visit: Payer: Self-pay | Admitting: Internal Medicine

## 2016-11-29 NOTE — Telephone Encounter (Signed)
Refill x 6 months 

## 2016-12-28 ENCOUNTER — Other Ambulatory Visit: Payer: Self-pay

## 2016-12-28 MED ORDER — OLMESARTAN MEDOXOMIL 40 MG PO TABS: 40.0000 mg | ORAL_TABLET | Freq: Every day | ORAL | 3 refills | Status: DC

## 2017-01-04 ENCOUNTER — Other Ambulatory Visit: Payer: Self-pay | Admitting: Internal Medicine

## 2017-04-06 ENCOUNTER — Other Ambulatory Visit: Payer: Self-pay | Admitting: Internal Medicine

## 2017-04-06 DIAGNOSIS — Z794 Long term (current) use of insulin: Principal | ICD-10-CM

## 2017-04-06 DIAGNOSIS — N183 Chronic kidney disease, stage 3 unspecified: Secondary | ICD-10-CM

## 2017-04-06 DIAGNOSIS — IMO0001 Reserved for inherently not codable concepts without codable children: Secondary | ICD-10-CM

## 2017-04-06 DIAGNOSIS — E119 Type 2 diabetes mellitus without complications: Principal | ICD-10-CM

## 2017-04-06 DIAGNOSIS — E785 Hyperlipidemia, unspecified: Secondary | ICD-10-CM

## 2017-04-19 ENCOUNTER — Other Ambulatory Visit: Payer: Medicare Other | Admitting: Internal Medicine

## 2017-04-19 DIAGNOSIS — E119 Type 2 diabetes mellitus without complications: Principal | ICD-10-CM

## 2017-04-19 DIAGNOSIS — IMO0001 Reserved for inherently not codable concepts without codable children: Secondary | ICD-10-CM

## 2017-04-19 DIAGNOSIS — Z794 Long term (current) use of insulin: Principal | ICD-10-CM

## 2017-04-19 DIAGNOSIS — N183 Chronic kidney disease, stage 3 unspecified: Secondary | ICD-10-CM

## 2017-04-19 DIAGNOSIS — E785 Hyperlipidemia, unspecified: Secondary | ICD-10-CM

## 2017-04-20 LAB — HEMOGLOBIN A1C
EAG (MMOL/L): 9.5 (calc)
Hgb A1c MFr Bld: 7.6 % of total Hgb — ABNORMAL HIGH (ref <5.7)
Mean Plasma Glucose: 171 (calc)

## 2017-04-20 LAB — LIPID PANEL
CHOLESTEROL: 154 mg/dL (ref <200)
HDL: 39 mg/dL — ABNORMAL LOW (ref >50)
LDL CHOLESTEROL (CALC): 90 mg/dL
Non-HDL Cholesterol (Calc): 115 mg/dL (calc) (ref <130)
Total CHOL/HDL Ratio: 3.9 (calc) (ref <5.0)
Triglycerides: 148 mg/dL (ref <150)

## 2017-04-20 LAB — BASIC METABOLIC PANEL
BUN / CREAT RATIO: 13 (calc) (ref 6–22)
BUN: 24 mg/dL (ref 7–25)
CHLORIDE: 104 mmol/L (ref 98–110)
CO2: 28 mmol/L (ref 20–32)
CREATININE: 1.79 mg/dL — AB (ref 0.60–0.93)
Calcium: 10.2 mg/dL (ref 8.6–10.4)
Glucose, Bld: 139 mg/dL — ABNORMAL HIGH (ref 65–99)
POTASSIUM: 4.5 mmol/L (ref 3.5–5.3)
SODIUM: 138 mmol/L (ref 135–146)

## 2017-04-20 LAB — HEPATIC FUNCTION PANEL
AG RATIO: 1.8 (calc) (ref 1.0–2.5)
ALKALINE PHOSPHATASE (APISO): 97 U/L (ref 33–130)
ALT: 22 U/L (ref 6–29)
AST: 14 U/L (ref 10–35)
Albumin: 4.6 g/dL (ref 3.6–5.1)
Bilirubin, Direct: 0.1 mg/dL (ref 0.0–0.2)
GLOBULIN: 2.6 g/dL (ref 1.9–3.7)
Indirect Bilirubin: 0.6 mg/dL (calc) (ref 0.2–1.2)
TOTAL PROTEIN: 7.2 g/dL (ref 6.1–8.1)
Total Bilirubin: 0.7 mg/dL (ref 0.2–1.2)

## 2017-04-20 LAB — MICROALBUMIN / CREATININE URINE RATIO
Creatinine, Urine: 32 mg/dL (ref 20–275)
MICROALB UR: 0.4 mg/dL
MICROALB/CREAT RATIO: 13 ug/mg{creat} (ref <30)

## 2017-04-22 ENCOUNTER — Ambulatory Visit (INDEPENDENT_AMBULATORY_CARE_PROVIDER_SITE_OTHER): Payer: Medicare Other | Admitting: Internal Medicine

## 2017-04-22 ENCOUNTER — Encounter: Payer: Self-pay | Admitting: Internal Medicine

## 2017-04-22 VITALS — BP 134/82 | HR 101 | Temp 98.1°F | Ht 61.0 in | Wt 164.0 lb

## 2017-04-22 DIAGNOSIS — F419 Anxiety disorder, unspecified: Secondary | ICD-10-CM

## 2017-04-22 DIAGNOSIS — I10 Essential (primary) hypertension: Secondary | ICD-10-CM | POA: Diagnosis not present

## 2017-04-22 DIAGNOSIS — N184 Chronic kidney disease, stage 4 (severe): Secondary | ICD-10-CM

## 2017-04-22 DIAGNOSIS — E785 Hyperlipidemia, unspecified: Secondary | ICD-10-CM | POA: Diagnosis not present

## 2017-04-22 DIAGNOSIS — E119 Type 2 diabetes mellitus without complications: Secondary | ICD-10-CM

## 2017-04-25 ENCOUNTER — Encounter: Payer: Self-pay | Admitting: Internal Medicine
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
## 2017-04-25 NOTE — Patient Instructions (Signed)
It was a pleasure to see you today.  Continue same medications and return in 6 months for physical exam.  Try to walk some for exercise.  Watch diet.

## 2017-08-07 ENCOUNTER — Telehealth: Payer: Self-pay | Admitting: Internal Medicine

## 2017-08-07 MED ORDER — ALPRAZOLAM 1 MG PO TABS: 1.0000 mg | ORAL_TABLET | Freq: Two times a day (BID) | ORAL | 5 refills | Status: DC | PRN

## 2017-08-07 NOTE — Telephone Encounter (Signed)
Refilled Xanax 1 mg #60 with 5 ref[facility] to [facility]

## 2017-09-22 ENCOUNTER — Other Ambulatory Visit: Payer: Self-pay

## 2017-09-22 MED ORDER — AMLODIPINE BESYLATE 10 MG PO TABS: 10.0000 mg | ORAL_TABLET | Freq: Every day | ORAL | 11 refills | Status: DC

## 2017-09-22 MED ORDER — AMLODIPINE-ATORVASTATIN 10-40 MG PO TABS: 1.0000 | ORAL_TABLET | Freq: Every day | ORAL | 11 refills | Status: DC

## 2017-09-22 MED ORDER — ATORVASTATIN CALCIUM 40 MG PO TABS: 40.0000 mg | ORAL_TABLET | Freq: Every day | ORAL | 11 refills | Status: DC

## 2017-09-22 NOTE — Addendum Note (Signed)
Addended by: Gregery NaVALENCIA, Solly Derasmo P on: 09/22/2017 04:38 PM   Modules accepted: Orders

## 2017-10-19 ENCOUNTER — Other Ambulatory Visit: Payer: Self-pay | Admitting: Internal Medicine

## 2017-10-19 DIAGNOSIS — E119 Type 2 diabetes mellitus without complications: Secondary | ICD-10-CM

## 2017-10-19 DIAGNOSIS — IMO0001 Reserved for inherently not codable concepts without codable children: Secondary | ICD-10-CM

## 2017-10-19 DIAGNOSIS — N183 Chronic kidney disease, stage 3 unspecified: Secondary | ICD-10-CM

## 2017-10-19 DIAGNOSIS — Z794 Long term (current) use of insulin: Secondary | ICD-10-CM

## 2017-10-19 DIAGNOSIS — Z0000 Encounter for general adult medical examination without abnormal findings: Secondary | ICD-10-CM

## 2017-10-19 DIAGNOSIS — E785 Hyperlipidemia, unspecified: Secondary | ICD-10-CM

## 2017-10-20 ENCOUNTER — Other Ambulatory Visit: Payer: Self-pay

## 2017-10-20 MED ORDER — INSULIN ASPART 100 UNIT/ML ~~LOC~~ SOLN: 7.0000 [IU] | Freq: Three times a day (TID) | SUBCUTANEOUS | 11 refills | Status: DC

## 2017-10-25 ENCOUNTER — Other Ambulatory Visit: Payer: Medicare Other | Admitting: Internal Medicine

## 2017-10-25 DIAGNOSIS — N183 Chronic kidney disease, stage 3 unspecified: Secondary | ICD-10-CM

## 2017-10-25 DIAGNOSIS — Z0000 Encounter for general adult medical examination without abnormal findings: Secondary | ICD-10-CM | POA: Diagnosis not present

## 2017-10-25 DIAGNOSIS — IMO0001 Reserved for inherently not codable concepts without codable children: Secondary | ICD-10-CM

## 2017-10-25 DIAGNOSIS — E785 Hyperlipidemia, unspecified: Secondary | ICD-10-CM | POA: Diagnosis not present

## 2017-10-25 DIAGNOSIS — Z794 Long term (current) use of insulin: Secondary | ICD-10-CM | POA: Diagnosis not present

## 2017-10-25 DIAGNOSIS — E119 Type 2 diabetes mellitus without complications: Secondary | ICD-10-CM

## 2017-10-26 LAB — HEMOGLOBIN A1C
EAG (MMOL/L): 8.9 (calc)
Hgb A1c MFr Bld: 7.2 % of total Hgb — ABNORMAL HIGH (ref <5.7)
MEAN PLASMA GLUCOSE: 160 (calc)

## 2017-10-26 LAB — CBC WITH DIFFERENTIAL/PLATELET
BASOS PCT: 0.8 %
Basophils Absolute: 49 cells/uL (ref 0–200)
EOS ABS: 43 {cells}/uL (ref 15–500)
Eosinophils Relative: 0.7 %
HCT: 41.2 % (ref 35.0–45.0)
HEMOGLOBIN: 12.9 g/dL (ref 11.7–15.5)
LYMPHS ABS: 1824 {cells}/uL (ref 850–3900)
MCH: 24.7 pg — AB (ref 27.0–33.0)
MCHC: 31.3 g/dL — ABNORMAL LOW (ref 32.0–36.0)
MCV: 78.9 fL — ABNORMAL LOW (ref 80.0–100.0)
MONOS PCT: 8.4 %
MPV: 11.3 fL (ref 7.5–12.5)
NEUTROS ABS: 3672 {cells}/uL (ref 1500–7800)
Neutrophils Relative %: 60.2 %
PLATELETS: 370 10*3/uL (ref 140–400)
RBC: 5.22 10*6/uL — ABNORMAL HIGH (ref 3.80–5.10)
RDW: 14.7 % (ref 11.0–15.0)
Total Lymphocyte: 29.9 %
WBC mixed population: 512 cells/uL (ref 200–950)
WBC: 6.1 10*3/uL (ref 3.8–10.8)

## 2017-10-26 LAB — LIPID PANEL
Cholesterol: 137 mg/dL (ref <200)
HDL: 44 mg/dL — ABNORMAL LOW (ref >50)
LDL CHOLESTEROL (CALC): 76 mg/dL
Non-HDL Cholesterol (Calc): 93 mg/dL (calc) (ref <130)
Total CHOL/HDL Ratio: 3.1 (calc) (ref <5.0)
Triglycerides: 90 mg/dL (ref <150)

## 2017-10-26 LAB — COMPLETE METABOLIC PANEL WITH GFR
AG Ratio: 1.7 (calc) (ref 1.0–2.5)
ALBUMIN MSPROF: 4.4 g/dL (ref 3.6–5.1)
ALKALINE PHOSPHATASE (APISO): 94 U/L (ref 33–130)
ALT: 13 U/L (ref 6–29)
AST: 15 U/L (ref 10–35)
BILIRUBIN TOTAL: 0.5 mg/dL (ref 0.2–1.2)
BUN / CREAT RATIO: 12 (calc) (ref 6–22)
BUN: 20 mg/dL (ref 7–25)
CHLORIDE: 106 mmol/L (ref 98–110)
CO2: 18 mmol/L — AB (ref 20–32)
CREATININE: 1.7 mg/dL — AB (ref 0.60–0.93)
Calcium: 9.7 mg/dL (ref 8.6–10.4)
GFR, Est African American: 34 mL/min/{1.73_m2} — ABNORMAL LOW (ref >=60)
GFR, Est Non African American: 30 mL/min/{1.73_m2} — ABNORMAL LOW (ref >=60)
GLUCOSE: 132 mg/dL — AB (ref 65–99)
Globulin: 2.6 g/dL (calc) (ref 1.9–3.7)
Potassium: 4.4 mmol/L (ref 3.5–5.3)
Sodium: 141 mmol/L (ref 135–146)
Total Protein: 7 g/dL (ref 6.1–8.1)

## 2017-10-26 LAB — MICROALBUMIN / CREATININE URINE RATIO
Creatinine, Urine: 76 mg/dL (ref 20–275)
MICROALB UR: 0.6 mg/dL
MICROALB/CREAT RATIO: 8 ug/mg{creat} (ref <30)

## 2017-10-26 LAB — TSH: TSH: 2.57 mIU/L (ref 0.40–4.50)

## 2017-10-28 ENCOUNTER — Encounter: Payer: Medicare Other | Admitting: Internal Medicine

## 2017-10-31 ENCOUNTER — Ambulatory Visit (INDEPENDENT_AMBULATORY_CARE_PROVIDER_SITE_OTHER): Payer: Medicare Other | Admitting: Internal Medicine

## 2017-10-31 ENCOUNTER — Encounter: Payer: Self-pay | Admitting: Internal Medicine

## 2017-10-31 VITALS — BP 150/70 | HR 82 | Temp 98.0°F | Ht 61.5 in | Wt 163.0 lb

## 2017-10-31 DIAGNOSIS — E119 Type 2 diabetes mellitus without complications: Secondary | ICD-10-CM | POA: Diagnosis not present

## 2017-10-31 DIAGNOSIS — Z23 Encounter for immunization: Secondary | ICD-10-CM | POA: Diagnosis not present

## 2017-10-31 DIAGNOSIS — R8290 Unspecified abnormal findings in urine: Secondary | ICD-10-CM | POA: Diagnosis not present

## 2017-10-31 DIAGNOSIS — Z0000 Encounter for general adult medical examination without abnormal findings: Secondary | ICD-10-CM

## 2017-10-31 DIAGNOSIS — Z794 Long term (current) use of insulin: Secondary | ICD-10-CM | POA: Diagnosis not present

## 2017-10-31 DIAGNOSIS — F411 Generalized anxiety disorder: Secondary | ICD-10-CM | POA: Diagnosis not present

## 2017-10-31 DIAGNOSIS — I10 Essential (primary) hypertension: Secondary | ICD-10-CM | POA: Diagnosis not present

## 2017-10-31 DIAGNOSIS — E785 Hyperlipidemia, unspecified: Secondary | ICD-10-CM

## 2017-10-31 DIAGNOSIS — IMO0001 Reserved for inherently not codable concepts without codable children: Secondary | ICD-10-CM

## 2017-10-31 DIAGNOSIS — G4700 Insomnia, unspecified: Secondary | ICD-10-CM | POA: Diagnosis not present

## 2017-10-31 DIAGNOSIS — N183 Chronic kidney disease, stage 3 unspecified: Secondary | ICD-10-CM

## 2017-10-31 DIAGNOSIS — E8881 Metabolic syndrome: Secondary | ICD-10-CM | POA: Diagnosis not present

## 2017-10-31 LAB — POCT URINALYSIS DIPSTICK
Appearance: NEGATIVE
BILIRUBIN UA: NEGATIVE
Blood, UA: NEGATIVE
GLUCOSE UA: NEGATIVE
KETONES UA: NEGATIVE
Nitrite, UA: NEGATIVE
Odor: NEGATIVE
PROTEIN UA: NEGATIVE
SPEC GRAV UA: 1.01 (ref 1.010–1.025)
Urobilinogen, UA: 0.2 E.U./dL
pH, UA: 6.5 (ref 5.0–8.0)

## 2017-10-31 NOTE — Progress Notes (Signed)
Subjective:    Patient ID: Erin [name], female    DOB: 04/21/1945, 72 y.o.   MRN: [id number]  HPI 72 year old Female in today for health maintenance exam, Medicare wellness and evaluation of medical issues including insulin-dependent diabetes mellitus, hypertension, hyperlipidemia chronic kidney disease and anxiety.  She was admitted to the hospital in 06/24/2008 with renal failure.  At that time she was in diabetic ketoacidosis with hyperosmolar state and her serum glucose was 1212.  Serum creatinine was 4.90 and she was dehydrated.  Her creatinine never returned to normal after that and she has chronic kidney disease.  Social history: She has 3 children.  She and her husband were married for 25 years.  He died of a massive MI in Jun 24, 2004.  She was born he gives a bili grew up in [place].  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  She does not smoke or consume alcohol.  She drives but not at night.  Family history: Father died in his 85s of diabetes.  One brother and one sister healthy.  Mother with history of breast cancer and diabetes died from multiorgan failure/sepsis.  One daughter with hypertension.  Another daughter in good health.    Medications have been changed recently due to expense and availability at health department.  She is now on Benicar 40 mg daily and amlodipine atorvastatin (Caduet) 10/40 daily.  Flu vaccine given.  Order given for mammogram.    Review of Systems  Constitutional: Negative.   HENT: Negative.   Respiratory: Negative.   Cardiovascular: Negative.   Gastrointestinal: Negative.   Genitourinary: Negative.   Neurological: Negative.   Psychiatric/Behavioral: Negative.        Objective:   Physical Exam  Constitutional: She is oriented to person, place, and time. She appears  well-developed and well-nourished. No distress.  HENT:  Head: Normocephalic and atraumatic.  Right Ear: External ear normal.  Left Ear: External ear normal.  Mouth/Throat: Oropharynx is clear and moist. No oropharyngeal exudate.  Eyes: Pupils are equal, round, and reactive to light. Conjunctivae and EOM are normal. Right eye exhibits no discharge. Left eye exhibits no discharge. No scleral icterus.  Neck: Neck supple. No JVD present. No thyromegaly present.  Cardiovascular: Normal rate, regular rhythm, normal heart sounds and intact distal pulses. Exam reveals no friction rub.  No murmur heard. Pulmonary/Chest: Effort normal and breath sounds normal. No respiratory distress. She has no wheezes. She has no rales.  Abdominal: Soft. She exhibits no distension and no mass. There is no tenderness. There is no rebound and no guarding.  Genitourinary:  Genitourinary Comments: Bimanual normal.  Pap deferred.  Musculoskeletal: She exhibits no edema.  Neurological: She is alert and oriented to person, place, and time. She displays normal reflexes. No cranial nerve deficit. She exhibits normal muscle tone. Coordination normal.  Skin: Skin is warm and dry. No rash noted. She is not diaphoretic.  Psychiatric: She has a normal mood and affect. Her behavior is normal. Judgment and thought content normal.  Vitals reviewed.         Assessment & Plan:  Insulin-dependent diabetes mellitus with stable hemoglobin A1c.  Essential hypertension-patient now on on Benicar and Caduet in addition to Lasix  Hyperlipidemia-stable  Anxiety-stable on Xanax  Reminded regarding annual eye exam.  Chronic kidney disease stage  III-creatinine is stable  History of insomnia  Plan: Return in 6 months.  Order given for home glucose monitor and test strips.  Okay to refill medications for 6 months.  Order given for mammogram.  Subjective:   Patient presents for Medicare Annual/Subsequent preventive  examination.  Review Past Medical/Family/Social: See above   Risk Factors  Current exercise habits: Housework Dietary issues discussed: Low-fat low carbohydrate  Cardiac risk factors: Hyperlipidemia, hypertension,  Depression Screen  (Note: if answer to either of the following is "Yes", a more complete depression screening is indicated)   Over the past two weeks, have you felt down, depressed or hopeless? No  Over the past two weeks, have you felt little interest or pleasure in doing things? No Have you lost interest or pleasure in daily life? No Do you often feel hopeless? No Do you cry easily over simple problems? No   Activities of Daily Living  In your present state of health, do you have any difficulty performing the following activities?:   Driving? No  Managing money? No  Feeding yourself? No  Getting from bed to chair? No  Climbing a flight of stairs? No  Preparing food and eating?: No  Bathing or showering? No  Getting dressed: No  Getting to the toilet? No  Using the toilet:No  Moving around from place to place: No  In the past year have you fallen or had a near fall?:No  Are you sexually active? No  Do you have more than one partner? No   Hearing Difficulties: No  Do you often ask people to speak up or repeat themselves? No  Do you experience ringing or noises in your ears? No  Do you have difficulty understanding soft or whispered voices? No  Do you feel that you have a problem with memory? No Do you often misplace items? No    Home Safety:  Do you have a smoke alarm at your residence? Yes Do you have grab bars in the bathroom?  Yes Do you have throw rugs in your house?  Yes   Cognitive Testing  Alert? Yes Normal Appearance?Yes  Oriented to person? Yes Place? Yes  Time? Yes  Recall of three objects? Yes  Can perform simple calculations? Yes  Displays appropriate judgment?Yes  Can read the correct time from a watch face?Yes   List the Names of  Other Physician/Practitioners you currently use:  See referral list for the physicians patient is currently seeing.     Review of Systems: See above   Objective:     General appearance: Appears stated age and mildly obese  Head: Normocephalic, without obvious abnormality, atraumatic  Eyes: conj clear, EOMi PEERLA  Ears: normal TM's and external ear canals both ears  Nose: Nares normal. Septum midline. Mucosa normal. No drainage or sinus tenderness.  Throat: lips, mucosa, and tongue normal; teeth and gums normal  Neck: no adenopathy, no carotid bruit, no JVD, supple, symmetrical, trachea midline and thyroid not enlarged, symmetric, no tenderness/mass/nodules  No CVA tenderness.  Lungs: clear to auscultation bilaterally  Breasts: normal appearance, no masses or tenderness, top of the pacemaker on left upper chest. Incision well-healed. It is tender.  Heart: regular rate and rhythm, S1, S2 normal, no murmur, click, rub or gallop  Abdomen: soft, non-tender; bowel sounds normal; no masses, no organomegaly  Musculoskeletal: ROM normal in all joints, no crepitus, no deformity, Normal muscle strengthen. Back  is symmetric, no curvature. Skin: Skin color, texture, turgor normal. No rashes or  lesions  Lymph nodes: Cervical, supraclavicular, and axillary nodes normal.  Neurologic: CN 2 -12 Normal, Normal symmetric reflexes. Normal coordination and gait  Psych: Alert & Oriented x 3, Mood appear stable.    Assessment:    Annual wellness medicare exam   Plan:    During the course of the visit the patient was educated and counseled about appropriate screening and preventive services including:   Order for mammogram  Flu vaccine given     Patient Instructions (the written plan) was given to the patient.  Medicare Attestation  I have personally reviewed:  The patient's medical and social history  Their use of alcohol, tobacco or illicit drugs  Their current medications and  supplements  The patient's functional ability including ADLs,fall risks, home safety risks, cognitive, and hearing and visual impairment  Diet and physical activities  Evidence for depression or mood disorders  The patient's weight, height, BMI, and visual acuity have been recorded in the chart. I have made referrals, counseling, and provided education to the patient based on review of the above and I have provided the patient with a written personalized care plan for preventive services.

## 2017-10-31 NOTE — Patient Instructions (Signed)
It was a pleasure to see you today.  Labs are stable.  Order written for Accu-Chek and test strips.  Order given for mammogram.

## 2017-11-02 LAB — URINE CULTURE
MICRO NUMBER:: 91170995
SPECIMEN QUALITY:: ADEQUATE

## 2017-12-15 ENCOUNTER — Other Ambulatory Visit: Payer: Self-pay

## 2017-12-15 MED ORDER — FUROSEMIDE 40 MG PO TABS: 40.0000 mg | ORAL_TABLET | Freq: Every day | ORAL | 3 refills | Status: DC

## 2018-03-21 ENCOUNTER — Other Ambulatory Visit: Payer: Self-pay

## 2018-03-21 ENCOUNTER — Telehealth: Payer: Self-pay | Admitting: Internal Medicine

## 2018-03-21 MED ORDER — ALPRAZOLAM 1 MG PO TABS: 1.0000 mg | ORAL_TABLET | Freq: Two times a day (BID) | ORAL | 5 refills | Status: DC | PRN

## 2018-03-21 NOTE — Telephone Encounter (Signed)
Patient is requesting a refill. Last filled on 08/07/2017 #30 + 5. She has an appointment in April, okay to call in?

## 2018-03-21 NOTE — Telephone Encounter (Signed)
Refilled by [facility] #[id number] with 5 refills

## 2018-05-02 ENCOUNTER — Other Ambulatory Visit: Payer: Medicare Other | Admitting: Internal Medicine

## 2018-05-02 ENCOUNTER — Other Ambulatory Visit: Payer: Self-pay

## 2018-05-02 DIAGNOSIS — E119 Type 2 diabetes mellitus without complications: Principal | ICD-10-CM

## 2018-05-02 DIAGNOSIS — IMO0001 Reserved for inherently not codable concepts without codable children: Secondary | ICD-10-CM

## 2018-05-02 DIAGNOSIS — Z794 Long term (current) use of insulin: Secondary | ICD-10-CM | POA: Diagnosis not present

## 2018-05-02 DIAGNOSIS — E785 Hyperlipidemia, unspecified: Secondary | ICD-10-CM | POA: Diagnosis not present

## 2018-05-02 DIAGNOSIS — I10 Essential (primary) hypertension: Secondary | ICD-10-CM

## 2018-05-03 LAB — MICROALBUMIN / CREATININE URINE RATIO
CREATININE, URINE: 34 mg/dL (ref 20–275)
Microalb Creat Ratio: 18 mcg/mg creat (ref <30)
Microalb, Ur: 0.6 mg/dL

## 2018-05-03 LAB — HEPATIC FUNCTION PANEL
AG Ratio: 1.8 (calc) (ref 1.0–2.5)
ALT: 25 U/L (ref 6–29)
AST: 19 U/L (ref 10–35)
Albumin: 4.4 g/dL (ref 3.6–5.1)
Alkaline phosphatase (APISO): 96 U/L (ref 37–153)
Bilirubin, Direct: 0.1 mg/dL (ref 0.0–0.2)
Globulin: 2.5 g/dL (calc) (ref 1.9–3.7)
Indirect Bilirubin: 0.7 mg/dL (calc) (ref 0.2–1.2)
TOTAL PROTEIN: 6.9 g/dL (ref 6.1–8.1)
Total Bilirubin: 0.8 mg/dL (ref 0.2–1.2)

## 2018-05-03 LAB — HEMOGLOBIN A1C
EAG (MMOL/L): 10 (calc)
HEMOGLOBIN A1C: 7.9 %{Hb} — AB (ref <5.7)
Mean Plasma Glucose: 180 (calc)

## 2018-05-03 LAB — BASIC METABOLIC PANEL
BUN/Creatinine Ratio: 17 (calc) (ref 6–22)
BUN: 28 mg/dL — ABNORMAL HIGH (ref 7–25)
CALCIUM: 9.9 mg/dL (ref 8.6–10.4)
CO2: 26 mmol/L (ref 20–32)
Chloride: 104 mmol/L (ref 98–110)
Creat: 1.68 mg/dL — ABNORMAL HIGH (ref 0.60–0.93)
GLUCOSE: 139 mg/dL — AB (ref 65–99)
Potassium: 4.7 mmol/L (ref 3.5–5.3)
SODIUM: 139 mmol/L (ref 135–146)

## 2018-05-03 LAB — LIPID PANEL
Cholesterol: 156 mg/dL (ref <200)
HDL: 41 mg/dL — ABNORMAL LOW (ref >=50)
LDL CHOLESTEROL (CALC): 95 mg/dL
Non-HDL Cholesterol (Calc): 115 mg/dL (calc) (ref <130)
TRIGLYCERIDES: 106 mg/dL (ref <150)
Total CHOL/HDL Ratio: 3.8 (calc) (ref <5.0)

## 2018-05-05 ENCOUNTER — Ambulatory Visit (INDEPENDENT_AMBULATORY_CARE_PROVIDER_SITE_OTHER): Payer: Medicare Other | Admitting: Internal Medicine

## 2018-05-05 ENCOUNTER — Other Ambulatory Visit: Payer: Self-pay

## 2018-05-05 ENCOUNTER — Encounter: Payer: Self-pay | Admitting: Internal Medicine

## 2018-05-05 VITALS — BP 129/79 | Wt 160.0 lb

## 2018-05-05 DIAGNOSIS — E8881 Metabolic syndrome: Secondary | ICD-10-CM | POA: Diagnosis not present

## 2018-05-05 DIAGNOSIS — N183 Chronic kidney disease, stage 3 unspecified: Secondary | ICD-10-CM

## 2018-05-05 DIAGNOSIS — Z794 Long term (current) use of insulin: Secondary | ICD-10-CM | POA: Diagnosis not present

## 2018-05-05 DIAGNOSIS — I129 Hypertensive chronic kidney disease with stage 1 through stage 4 chronic kidney disease, or unspecified chronic kidney disease: Secondary | ICD-10-CM

## 2018-05-05 DIAGNOSIS — E785 Hyperlipidemia, unspecified: Secondary | ICD-10-CM | POA: Diagnosis not present

## 2018-05-05 DIAGNOSIS — I10 Essential (primary) hypertension: Secondary | ICD-10-CM

## 2018-05-05 DIAGNOSIS — F411 Generalized anxiety disorder: Secondary | ICD-10-CM | POA: Diagnosis not present

## 2018-05-05 DIAGNOSIS — IMO0001 Reserved for inherently not codable concepts without codable children: Secondary | ICD-10-CM

## 2018-05-05 DIAGNOSIS — E119 Type 2 diabetes mellitus without complications: Principal | ICD-10-CM

## 2018-05-05 DIAGNOSIS — E1122 Type 2 diabetes mellitus with diabetic chronic kidney disease: Secondary | ICD-10-CM | POA: Diagnosis not present

## 2018-05-05 NOTE — Patient Instructions (Signed)
Lab work reviewed.  Hemoglobin A1c has increased to 7.9%.  Patient needs to get more outdoor exercise on nice days.  Her anxiety is stable.  She has refills on Xanax.  Kidney functions are stable.  Cholesterol panel is normal.  We are pleased with results except for hemoglobin A1c.  She will walk some more outdoors on nice days.  Physical exam due late September 2020.

## 2018-05-05 NOTE — Progress Notes (Signed)
   Subjective:    Patient ID: Erin [name], female    DOB: [birth year], 73 y.o.   MRN: [id number]  HPI 73 year old Female seen by virtual visit today for 6 month recheck.  Interactive audio and video communications were achieved between provider and patient.  Patient identified visually as Erin [name] and confirmed date of birth [birth year].  Patient says she is doing well staying at home.  Has not been exercising much.  She already has been doing her grocery shopping.  She is not walking much outside.    Review of Systems only  complaint is anxiety.  Wants to make sure she has Xanax refilled.  She does have additional refills through July     Objective:   Physical Exam  She appears to be in no acute distress.  Seen by video conferencing.  Lab work obtained prior to visit.      Assessment & Plan:  Her labs were reviewed with her today.  Hemoglobin A1c has increased to 7.9%.  Previously was 7.2%.  Hyperlipidemia-lipid panel and liver functions are normal on statin medication.  She has a low HDL of 41.  Essential hypertension-she gives blood pressure reading obtained today at 129/79.  This blood pressure reading is better than what we usually get in the office.  She has an element of office hypertension.  Lack of exercise-needs to get out and walk some.  She reports her weight is 160 pounds today.  Weight in September 2019 was 163 pounds here.  Chronic kidney diseas -creatinine is stable at 1.68 and previously was 1.70.  No change in medications  Sodium and potassium are normal.  Liver functions are normal.  She has insulin-dependent diabetes and is on NovoLog 7 units 3 times daily before meals and Lantus 50 units at 10 PM.  She continues with Lasix 40 mg daily and olmesartan 40 mg daily as well and combination amlodipine/atorvastatin 10/40 daily.  She takes Xanax 1 mg twice daily as needed for anxiety.  Plan: Physical exam is due late September 2020.  Encourage diet and exercise  to improve hemoglobin A1c.  Patient says she will walk some.  20 minutes spent with patient.

## 2018-10-31 ENCOUNTER — Other Ambulatory Visit: Payer: Self-pay

## 2018-10-31 ENCOUNTER — Other Ambulatory Visit: Payer: Medicare Other | Admitting: Internal Medicine

## 2018-10-31 DIAGNOSIS — G4700 Insomnia, unspecified: Secondary | ICD-10-CM

## 2018-10-31 DIAGNOSIS — E8881 Metabolic syndrome: Secondary | ICD-10-CM

## 2018-10-31 DIAGNOSIS — E119 Type 2 diabetes mellitus without complications: Secondary | ICD-10-CM | POA: Diagnosis not present

## 2018-10-31 DIAGNOSIS — E785 Hyperlipidemia, unspecified: Secondary | ICD-10-CM | POA: Diagnosis not present

## 2018-10-31 DIAGNOSIS — N183 Chronic kidney disease, stage 3 unspecified: Secondary | ICD-10-CM

## 2018-10-31 DIAGNOSIS — IMO0001 Reserved for inherently not codable concepts without codable children: Secondary | ICD-10-CM

## 2018-10-31 DIAGNOSIS — F411 Generalized anxiety disorder: Secondary | ICD-10-CM

## 2018-10-31 DIAGNOSIS — I10 Essential (primary) hypertension: Secondary | ICD-10-CM

## 2018-10-31 DIAGNOSIS — Z794 Long term (current) use of insulin: Secondary | ICD-10-CM | POA: Diagnosis not present

## 2018-10-31 DIAGNOSIS — Z0000 Encounter for general adult medical examination without abnormal findings: Secondary | ICD-10-CM | POA: Diagnosis not present

## 2018-11-01 LAB — CBC WITH DIFFERENTIAL/PLATELET
Absolute Monocytes: 539 cells/uL (ref 200–950)
Basophils Absolute: 17 cells/uL (ref 0–200)
Basophils Relative: 0.3 %
Eosinophils Absolute: 17 cells/uL (ref 15–500)
Eosinophils Relative: 0.3 %
HCT: 43 % (ref 35.0–45.0)
Hemoglobin: 13.3 g/dL (ref 11.7–15.5)
Lymphs Abs: 1978 cells/uL (ref 850–3900)
MCH: 24.9 pg — ABNORMAL LOW (ref 27.0–33.0)
MCHC: 30.9 g/dL — ABNORMAL LOW (ref 32.0–36.0)
MCV: 80.4 fL (ref 80.0–100.0)
MPV: 11.2 fL (ref 7.5–12.5)
Monocytes Relative: 9.3 %
Neutro Abs: 3248 cells/uL (ref 1500–7800)
Neutrophils Relative %: 56 %
Platelets: 342 10*3/uL (ref 140–400)
RBC: 5.35 10*6/uL — ABNORMAL HIGH (ref 3.80–5.10)
RDW: 14.5 % (ref 11.0–15.0)
Total Lymphocyte: 34.1 %
WBC: 5.8 10*3/uL (ref 3.8–10.8)

## 2018-11-01 LAB — COMPLETE METABOLIC PANEL WITH GFR
AG Ratio: 2.1 (calc) (ref 1.0–2.5)
ALT: 18 U/L (ref 6–29)
AST: 15 U/L (ref 10–35)
Albumin: 4.6 g/dL (ref 3.6–5.1)
Alkaline phosphatase (APISO): 85 U/L (ref 37–153)
BUN/Creatinine Ratio: 15 (calc) (ref 6–22)
BUN: 22 mg/dL (ref 7–25)
CO2: 27 mmol/L (ref 20–32)
Calcium: 10 mg/dL (ref 8.6–10.4)
Chloride: 106 mmol/L (ref 98–110)
Creat: 1.49 mg/dL — ABNORMAL HIGH (ref 0.60–0.93)
GFR, Est African American: 40 mL/min/{1.73_m2} — ABNORMAL LOW (ref >=60)
GFR, Est Non African American: 34 mL/min/{1.73_m2} — ABNORMAL LOW (ref >=60)
Globulin: 2.2 g/dL (calc) (ref 1.9–3.7)
Glucose, Bld: 138 mg/dL — ABNORMAL HIGH (ref 65–99)
Potassium: 4.4 mmol/L (ref 3.5–5.3)
Sodium: 140 mmol/L (ref 135–146)
Total Bilirubin: 0.8 mg/dL (ref 0.2–1.2)
Total Protein: 6.8 g/dL (ref 6.1–8.1)

## 2018-11-01 LAB — LIPID PANEL
Cholesterol: 141 mg/dL (ref <200)
HDL: 39 mg/dL — ABNORMAL LOW (ref >=50)
LDL Cholesterol (Calc): 83 mg/dL (calc)
Non-HDL Cholesterol (Calc): 102 mg/dL (calc) (ref <130)
Total CHOL/HDL Ratio: 3.6 (calc) (ref <5.0)
Triglycerides: 95 mg/dL (ref <150)

## 2018-11-01 LAB — TSH: TSH: 2.11 mIU/L (ref 0.40–4.50)

## 2018-11-01 LAB — HEMOGLOBIN A1C
Hgb A1c MFr Bld: 7.3 % of total Hgb — ABNORMAL HIGH (ref <5.7)
Mean Plasma Glucose: 163 (calc)
eAG (mmol/L): 9 (calc)

## 2018-11-03 ENCOUNTER — Other Ambulatory Visit: Payer: Self-pay

## 2018-11-03 ENCOUNTER — Ambulatory Visit (INDEPENDENT_AMBULATORY_CARE_PROVIDER_SITE_OTHER): Payer: Medicare Other | Admitting: Internal Medicine

## 2018-11-03 ENCOUNTER — Encounter: Payer: Self-pay | Admitting: Internal Medicine

## 2018-11-03 VITALS — BP 140/76 | HR 101 | Temp 98.0°F | Ht 62.0 in | Wt 164.0 lb

## 2018-11-03 DIAGNOSIS — E785 Hyperlipidemia, unspecified: Secondary | ICD-10-CM

## 2018-11-03 DIAGNOSIS — I10 Essential (primary) hypertension: Secondary | ICD-10-CM

## 2018-11-03 DIAGNOSIS — Z0000 Encounter for general adult medical examination without abnormal findings: Secondary | ICD-10-CM

## 2018-11-03 DIAGNOSIS — E8881 Metabolic syndrome: Secondary | ICD-10-CM

## 2018-11-03 DIAGNOSIS — E1169 Type 2 diabetes mellitus with other specified complication: Secondary | ICD-10-CM

## 2018-11-03 DIAGNOSIS — Z23 Encounter for immunization: Secondary | ICD-10-CM

## 2018-11-03 DIAGNOSIS — N1831 Chronic kidney disease, stage 3a: Secondary | ICD-10-CM | POA: Diagnosis not present

## 2018-11-03 DIAGNOSIS — F411 Generalized anxiety disorder: Secondary | ICD-10-CM

## 2018-11-03 DIAGNOSIS — R8290 Unspecified abnormal findings in urine: Secondary | ICD-10-CM

## 2018-11-03 LAB — POCT URINALYSIS DIPSTICK
Bilirubin, UA: NEGATIVE
Blood, UA: NEGATIVE
Glucose, UA: NEGATIVE
Ketones, UA: NEGATIVE
Nitrite, UA: NEGATIVE
Protein, UA: NEGATIVE
Spec Grav, UA: 1.015
Urobilinogen, UA: 0.2 U/dL
pH, UA: 5

## 2018-11-03 NOTE — Progress Notes (Signed)
Subjective:    Patient ID: Erin [name], female    DOB: Nov 26, 1945, 73 y.o.   MRN: [id number]  HPI 73 year old Female in today for Medicare wellness visit, health maintenance exam and evaluation of medical issues including anxiety, chronic kidney disease stage IV, hyperlipidemia, hypertension insulin-dependent diabetes and metabolic syndrome.  She was admitted to the hospital in 2010 with renal failure.  At that time was in diabetic ketoacidosis with hyperosmolar state and her serum glucose was 1212.  Serum creatinine was 4.90 and she was dehydrated.  Her creatinine never returned to normal after that she has chronic kidney disease which is stable.  Longstanding history of hypertension.  Social history: She has 3 daughters.  She and her husband were married for some 25 years and he died of a massive MI in 2006.  She was born in [place] and grew up in [place].  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  She is now retired.  Does not smoke or consume alcohol.  Family history: Father died in his 5470s of diabetes.  Mother died around 2016 with with multiorgan failure and septicemia with history of breast cancer and diabetes.  One sister and 1 brother.  One daughter healthy and 1 daughter with history of hypertension.  Labs reviewed.  She has a low HDL of 39 otherwise lipid panel is normal.  Hemoglobin A1c 7.3% and previously was 7.9% in March.  Urine dipstick is abnormal but culture revealed mixed flora.  Suspect this was not a clean-catch urine specimen. Fasting glucose 138.  Creatinine stable at 1.49 and improved from 1.68 in March.  Liver functions are normal.  CBC within normal limits.   Review of Systems longstanding history of anxiety but seems to be doing well during the pandemic.  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  Nodes none.  TMs and pharynx are clear.  Neck is supple without JVD thyromegaly or carotid bruits.  Chest is clear to auscultation without rales or wheezing.  Breasts normal female without masses.  Cardiac exam regular rate and rhythm normal S1 and S2.  Abdomen soft nondistended without hepatosplenomegaly masses or tenderness.  Bimanual exam normal.  No lower extremity pitting edema.  Affect is slightly anxious.  Neuro no focal deficits on brief neurological exam.       Assessment & Plan:  Essential hypertension-stable on current regimen  Anxiety state treated with Xanax and  doing well during the pandemic.  Family is supportive.  Chronic kidney disease stage IIIa-stable  Insulin-dependent diabetes mellitus with stable hemoglobin A1c  Hyperlipidemia-stable with statin medication  Low HDL  Plan: Flu vaccine given.  Labs reviewed with patient.  Continue current medications and follow-up in 6 months.  Reminded about annual diabetic eye exam.  Last one on file 2018.  Reminded about annual mammogram.  Subjective:   Patient presents for Medicare Annual/Subsequent preventive examination.  Review Past Medical/Family/Social: See above   Risk Factors  Current exercise habits: Does housework and a little yard work Dietary issues discussed: Low-fat low carbohydrate discussed  Cardiac risk factors: Hyperlipidemia and hypertension  Depression Screen  (Note: if answer to either of the following is "Yes", a more complete depression screening is indicated)  Over the past two weeks, have you felt down, depressed or hopeless? No  Over the past two weeks, have you felt little interest or pleasure in doing things? No Have you lost interest or pleasure in daily life? No Do you often feel hopeless? No Do you cry easily over simple problems? No   Activities of Daily Living  In your present state of  health, do you have any difficulty performing the following activities?:   Driving? No  Managing money? No  Feeding yourself? No  Getting from bed to chair? No  Climbing a flight of stairs? No  Preparing food and eating?: No  Bathing or showering? No  Getting dressed: No  Getting to the toilet? No  Using the toilet:No  Moving around from place to place: No  In the past year have you fallen or had a near fall?:No  Are you sexually active? No  Do you have more than one partner? No   Hearing Difficulties: No  Do you often ask people to speak up or repeat themselves? No  Do you experience ringing or noises in your ears? No  Do you have difficulty understanding soft or whispered voices? No  Do you feel that you have a problem with memory? No Do you often misplace items? No    Home Safety:  Do you have a smoke alarm at your residence? Yes Do you have grab bars in the bathroom?  He has Do you have throw rugs in your house?  Yes   Cognitive Testing  Alert? Yes Normal Appearance?Yes  Oriented to person? Yes Place? Yes  Time? Yes  Recall of three objects? Yes  Can perform simple calculations? Yes  Displays appropriate judgment?Yes  Can read the correct time from a watch face?Yes   List the Names of Other Physician/Practitioners you currently use:  See referral list for the physicians patient is currently seeing.     Review of Systems: See above   Objective:     General appearance: Appears stated age and mildly obese  Head: Normocephalic, without obvious abnormality, atraumatic  Eyes: conj clear, EOMi PEERLA  Ears: normal TM's and external ear canals both ears  Nose: Nares normal. Septum midline. Mucosa normal. No drainage or sinus tenderness.  Throat: lips, mucosa, and tongue normal; teeth and gums normal  Neck: no adenopathy, no carotid bruit, no JVD, supple, symmetrical, trachea midline and thyroid not enlarged, symmetric, no tenderness/mass/nodules  No CVA  tenderness.  Lungs: clear to auscultation bilaterally  Breasts: normal appearance, no masses or tenderness Heart: regular rate and rhythm, S1, S2 normal, no murmur, click, rub or gallop  Abdomen: soft, non-tender; bowel sounds normal; no masses, no organomegaly  Musculoskeletal: ROM normal in all joints, no crepitus, no deformity, Normal muscle strengthen. Back  is symmetric, no curvature. Skin: Skin color, texture, turgor normal. No rashes or lesions  Lymph nodes: Cervical, supraclavicular, and axillary nodes normal.  Neurologic: CN 2 -12 Normal, Normal symmetric reflexes. Normal coordination and gait  Psych: Alert & Oriented x 3, Mood appear stable.    Assessment:    Annual wellness medicare exam   Plan:    During the course of the visit the patient was educated and counseled about appropriate screening and preventive services including:   Annual flu vaccine given  Reminded about annual diabetic eye exam and mammogram     Patient Instructions (the written plan) was given to the patient.  Medicare Attestation  I have personally reviewed:  The patient's medical  and social history  Their use of alcohol, tobacco or illicit drugs  Their current medications and supplements  The patient's functional ability including ADLs,fall risks, home safety risks, cognitive, and hearing and visual impairment  Diet and physical activities  Evidence for depression or mood disorders  The patient's weight, height, BMI, and visual acuity have been recorded in the chart. I have made referrals, counseling, and provided education to the patient based on review of the above and I have provided the patient with a written personalized care plan for preventive services.

## 2018-11-04 LAB — URINE CULTURE
MICRO NUMBER:: 948635
SPECIMEN QUALITY:: ADEQUATE

## 2018-11-06 ENCOUNTER — Other Ambulatory Visit: Payer: Self-pay | Admitting: Internal Medicine

## 2018-11-26 MED ORDER — ALPRAZOLAM 1 MG PO TABS: 1.0000 mg | ORAL_TABLET | Freq: Two times a day (BID) | ORAL | 5 refills | Status: DC | PRN

## 2018-11-26 NOTE — Patient Instructions (Signed)
It was a pleasure to see you today.  Flu vaccine given.  Continue current medications.  Try to get some light exercise.  Follow-up in 6 months.  Needs to have mammogram and diabetic eye exam.

## 2018-11-27 ENCOUNTER — Other Ambulatory Visit: Payer: Self-pay | Admitting: Internal Medicine

## 2018-12-06 ENCOUNTER — Other Ambulatory Visit: Payer: Self-pay

## 2018-12-06 DIAGNOSIS — E109 Type 1 diabetes mellitus without complications: Secondary | ICD-10-CM

## 2018-12-06 MED ORDER — INSULIN ASPART 100 UNIT/ML ~~LOC~~ SOLN: 7.0000 [IU] | Freq: Three times a day (TID) | SUBCUTANEOUS | 11 refills | Status: DC

## 2018-12-18 ENCOUNTER — Telehealth: Payer: Self-pay | Admitting: Internal Medicine

## 2018-12-18 MED ORDER — FUROSEMIDE 40 MG PO TABS: 40.0000 mg | ORAL_TABLET | Freq: Every day | ORAL | 3 refills | Status: DC

## 2018-12-18 NOTE — Addendum Note (Signed)
Addended by: Mady Haagensen on: 12/18/2018 10:45 AM   Modules accepted: Orders

## 2018-12-18 NOTE — Telephone Encounter (Signed)
Fax from [facility], [facility] [facility]. Furosemide 40mg  tab Last filled 12/16/2018 Last OV: 11/03/18 Last CPE: 11/03/18 Next CPE: Due around 11/04/19

## 2019-03-25 ENCOUNTER — Other Ambulatory Visit: Payer: Self-pay | Admitting: Internal Medicine

## 2019-03-28 ENCOUNTER — Other Ambulatory Visit: Payer: Self-pay | Admitting: Internal Medicine

## 2019-05-22 ENCOUNTER — Other Ambulatory Visit: Payer: Medicare Other | Admitting: Internal Medicine

## 2019-05-22 ENCOUNTER — Other Ambulatory Visit: Payer: Self-pay

## 2019-05-22 DIAGNOSIS — E1169 Type 2 diabetes mellitus with other specified complication: Secondary | ICD-10-CM | POA: Diagnosis not present

## 2019-05-22 DIAGNOSIS — E119 Type 2 diabetes mellitus without complications: Secondary | ICD-10-CM

## 2019-05-22 DIAGNOSIS — E785 Hyperlipidemia, unspecified: Secondary | ICD-10-CM | POA: Diagnosis not present

## 2019-05-23 LAB — HEMOGLOBIN A1C
Hgb A1c MFr Bld: 7.3 % of total Hgb — ABNORMAL HIGH (ref <5.7)
Mean Plasma Glucose: 163 (calc)
eAG (mmol/L): 9 (calc)

## 2019-05-23 LAB — LIPID PANEL
Cholesterol: 154 mg/dL (ref <200)
HDL: 45 mg/dL — ABNORMAL LOW (ref >=50)
LDL Cholesterol (Calc): 86 mg/dL (calc)
Non-HDL Cholesterol (Calc): 109 mg/dL (calc) (ref <130)
Total CHOL/HDL Ratio: 3.4 (calc) (ref <5.0)
Triglycerides: 136 mg/dL (ref <150)

## 2019-05-25 ENCOUNTER — Encounter: Payer: Self-pay | Admitting: Internal Medicine

## 2019-05-25 ENCOUNTER — Ambulatory Visit (INDEPENDENT_AMBULATORY_CARE_PROVIDER_SITE_OTHER): Payer: Medicare Other | Admitting: Internal Medicine

## 2019-05-25 ENCOUNTER — Other Ambulatory Visit: Payer: Self-pay

## 2019-05-25 VITALS — BP 150/90 | HR 88 | Temp 98.0°F | Ht 62.0 in | Wt 167.0 lb

## 2019-05-25 DIAGNOSIS — E785 Hyperlipidemia, unspecified: Secondary | ICD-10-CM

## 2019-05-25 DIAGNOSIS — E8881 Metabolic syndrome: Secondary | ICD-10-CM | POA: Diagnosis not present

## 2019-05-25 DIAGNOSIS — I10 Essential (primary) hypertension: Secondary | ICD-10-CM

## 2019-05-25 DIAGNOSIS — E1169 Type 2 diabetes mellitus with other specified complication: Secondary | ICD-10-CM

## 2019-05-25 DIAGNOSIS — N1831 Chronic kidney disease, stage 3a: Secondary | ICD-10-CM | POA: Diagnosis not present

## 2019-05-25 DIAGNOSIS — F411 Generalized anxiety disorder: Secondary | ICD-10-CM | POA: Diagnosis not present

## 2019-05-25 MED ORDER — ALPRAZOLAM 1 MG PO TABS: 1.0000 mg | ORAL_TABLET | Freq: Two times a day (BID) | ORAL | 5 refills | Status: DC | PRN

## 2019-05-25 NOTE — Patient Instructions (Signed)
It was a pleasure to see you today.  Xanax refilled as requested.  Please have diabetic eye exam.  Continue current medications and follow-up with physical exam and lab work in 6 months. Recommend Covid-19 vaccine.

## 2019-05-25 NOTE — Progress Notes (Signed)
   Subjective:    Patient ID: Erin [name], female    DOB: [birth year], 74 y.o.   MRN: [id number]  HPI   Pleasant 74 year old Female for 6 month recheck on chronic kidney disease, insulin-dependent diabetes mellitus, hypertension, and anxiety.  Xanax refilled for 6 months today.   She has not had COVID-19 vaccine yet and is asking my opinion.  I think she should go ahead and take it. Her daughter,Erin [name], agrees  she says.    She cooks and does her own housework.  Diabetes is insulin-dependent.  She uses 50 units of Lantus subcu at 10 PM and NovoLog 7 units and scan 3 times a day before meals.  Blood pressure is controlled with Lasix , Caduet, as well as olmesartan.  History of anxiety state particularly after death of husband and takes Xanax 1 mg twice daily.  He died suddenly of an MI.  Her daughters are supportive and checking in with her regularly.   Review of Systems no chest pain or shortness of breath.  No falls.  Not depressed.     Objective:   Physical Exam Blood pressure 150/90 pulse 88 temperature 98 degrees orally pulse oximetry 98% weight 167 pounds BMI 30.54.  Skin warm and dry.  Nodes none.  Chest is clear to auscultation.  No JVD.  No thyromegaly.  No carotid bruits.  Cardiac exam regular rate and rhythm.  Rate 90.  Extremities without edema.       Assessment & Plan:  Chronic kidney disease-creatinine is 1.49 and is actually improved over the past couple of years.  It was 1.79 in March 2019.  Hyperlipidemia-stable on statin medication.  Total cholesterol is 154, triglycerides 136 and LDL cholesterol 86.  Has low HDL of 45.  Essential hypertension-stable has labile hypertension secondary to anxiety- continue to monitor.  Continue Benicar 40 mg daily and Lasix 40 mg daily as well as [place] 10-40 daily.  Type 2 diabetes mellitus-insulin-dependent-hemoglobin A1c stable at 7.3%.  Generally hemoglobin A1c over the past few years has been in the mid 7 range.  She injects 7  units of NovoLog subcutaneously 3 times daily before meals and uses Lantus 50 units subcu at 10 PM.  Anxiety is stable on Xanax -continue Xanax 1 mg twice daily.  This was refilled.  Plan: Advised patient to take COVID-19 vaccine.  Follow-up in 6 months or as needed.  Have diabetic eye exam.

## 2019-09-24 ENCOUNTER — Other Ambulatory Visit: Payer: Self-pay | Admitting: Internal Medicine

## 2019-09-28 DIAGNOSIS — H1820 Unspecified corneal edema: Secondary | ICD-10-CM | POA: Diagnosis not present

## 2019-09-28 DIAGNOSIS — H04123 Dry eye syndrome of bilateral lacrimal glands: Secondary | ICD-10-CM | POA: Diagnosis not present

## 2019-09-28 DIAGNOSIS — H524 Presbyopia: Secondary | ICD-10-CM | POA: Diagnosis not present

## 2019-09-28 DIAGNOSIS — E119 Type 2 diabetes mellitus without complications: Secondary | ICD-10-CM | POA: Diagnosis not present

## 2019-09-28 DIAGNOSIS — H40013 Open angle with borderline findings, low risk, bilateral: Secondary | ICD-10-CM | POA: Diagnosis not present

## 2019-09-28 DIAGNOSIS — H52222 Regular astigmatism, left eye: Secondary | ICD-10-CM | POA: Diagnosis not present

## 2019-09-28 LAB — HM DIABETES EYE EXAM

## 2019-10-19 DIAGNOSIS — H40013 Open angle with borderline findings, low risk, bilateral: Secondary | ICD-10-CM | POA: Diagnosis not present

## 2019-10-19 DIAGNOSIS — H04123 Dry eye syndrome of bilateral lacrimal glands: Secondary | ICD-10-CM | POA: Diagnosis not present

## 2019-10-19 DIAGNOSIS — H1820 Unspecified corneal edema: Secondary | ICD-10-CM | POA: Diagnosis not present

## 2019-10-30 ENCOUNTER — Other Ambulatory Visit: Payer: Self-pay | Admitting: Internal Medicine

## 2019-10-30 DIAGNOSIS — E109 Type 1 diabetes mellitus without complications: Secondary | ICD-10-CM

## 2019-11-23 DIAGNOSIS — H1820 Unspecified corneal edema: Secondary | ICD-10-CM | POA: Diagnosis not present

## 2019-11-23 DIAGNOSIS — H40013 Open angle with borderline findings, low risk, bilateral: Secondary | ICD-10-CM | POA: Diagnosis not present

## 2019-11-26 ENCOUNTER — Other Ambulatory Visit: Payer: Medicare Other | Admitting: Internal Medicine

## 2019-11-27 ENCOUNTER — Other Ambulatory Visit: Payer: Self-pay

## 2019-11-27 ENCOUNTER — Other Ambulatory Visit: Payer: Medicare Other | Admitting: Internal Medicine

## 2019-11-27 DIAGNOSIS — F411 Generalized anxiety disorder: Secondary | ICD-10-CM | POA: Diagnosis not present

## 2019-11-27 DIAGNOSIS — N183 Chronic kidney disease, stage 3 unspecified: Secondary | ICD-10-CM | POA: Diagnosis not present

## 2019-11-27 DIAGNOSIS — E8881 Metabolic syndrome: Secondary | ICD-10-CM

## 2019-11-27 DIAGNOSIS — N1831 Chronic kidney disease, stage 3a: Secondary | ICD-10-CM

## 2019-11-27 DIAGNOSIS — I10 Essential (primary) hypertension: Secondary | ICD-10-CM

## 2019-11-27 DIAGNOSIS — E1169 Type 2 diabetes mellitus with other specified complication: Secondary | ICD-10-CM | POA: Diagnosis not present

## 2019-11-27 DIAGNOSIS — E785 Hyperlipidemia, unspecified: Secondary | ICD-10-CM | POA: Diagnosis not present

## 2019-11-27 DIAGNOSIS — Z0000 Encounter for general adult medical examination without abnormal findings: Secondary | ICD-10-CM

## 2019-11-27 DIAGNOSIS — E119 Type 2 diabetes mellitus without complications: Secondary | ICD-10-CM

## 2019-11-30 ENCOUNTER — Encounter: Payer: Self-pay | Admitting: Internal Medicine

## 2019-11-30 ENCOUNTER — Other Ambulatory Visit: Payer: Self-pay

## 2019-11-30 ENCOUNTER — Ambulatory Visit (INDEPENDENT_AMBULATORY_CARE_PROVIDER_SITE_OTHER): Payer: Medicare Other | Admitting: Internal Medicine

## 2019-11-30 VITALS — BP 140/60 | HR 100 | Temp 98.0°F | Ht 61.0 in | Wt 163.0 lb

## 2019-11-30 DIAGNOSIS — E785 Hyperlipidemia, unspecified: Secondary | ICD-10-CM

## 2019-11-30 DIAGNOSIS — I10 Essential (primary) hypertension: Secondary | ICD-10-CM | POA: Diagnosis not present

## 2019-11-30 DIAGNOSIS — F411 Generalized anxiety disorder: Secondary | ICD-10-CM

## 2019-11-30 DIAGNOSIS — Z0000 Encounter for general adult medical examination without abnormal findings: Secondary | ICD-10-CM | POA: Diagnosis not present

## 2019-11-30 DIAGNOSIS — N1831 Chronic kidney disease, stage 3a: Secondary | ICD-10-CM | POA: Diagnosis not present

## 2019-11-30 DIAGNOSIS — Z23 Encounter for immunization: Secondary | ICD-10-CM | POA: Diagnosis not present

## 2019-11-30 DIAGNOSIS — E1169 Type 2 diabetes mellitus with other specified complication: Secondary | ICD-10-CM | POA: Diagnosis not present

## 2019-11-30 LAB — POCT URINALYSIS DIPSTICK
Appearance: NEGATIVE
Bilirubin, UA: NEGATIVE
Blood, UA: NEGATIVE
Glucose, UA: NEGATIVE
Ketones, UA: NEGATIVE
Leukocytes, UA: NEGATIVE
Nitrite, UA: NEGATIVE
Odor: NEGATIVE
Protein, UA: NEGATIVE
Spec Grav, UA: 1.01 (ref 1.010–1.025)
Urobilinogen, UA: 0.2 E.U./dL
pH, UA: 6.5 (ref 5.0–8.0)

## 2019-11-30 MED ORDER — ERGOCALCIFEROL 1.25 MG (50000 UT) PO CAPS: 50000.0000 [IU] | ORAL_CAPSULE | ORAL | 3 refills | Status: DC

## 2019-11-30 NOTE — Progress Notes (Signed)
Subjective:    Patient ID: Erin [name], female    DOB: Apr 20, 1945, 74 y.o.   MRN: [id number]  HPI 74 year old Female for Medicare wellness, health maintenance exam, and evaluation of medical issues.  Had eye exam by [facility] on August 27.  Intraocular pressure elevated at 19 in the left eye and 29 in the right.  Patient has anxiety, chronic kidney disease stage IV, hyperlipidemia, hypertension, insulin-dependent diabetes and metabolic syndrome.  She was admitted to the hospital in Jun 07, 2008 with renal failure.  At that time was in diabetic ketoacidosis with hyperosmolar state.  Serum glucose was 1212.  Serum creatinine was 4.90 when she was dehydrated.  Her creatinine never returned to normal after that.  She has chronic kidney disease which is stable.  She has a longstanding history of hypertension prior to that as well.  Social history: She has 3 daughters.  She and her husband were married for some 25 years and he died of a massive MI in Jun 07, 2004.  She was born in [place] and grew up in [place].  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  She is now retired.  Does not smoke or consume alcohol.  Family history: Father died in his 59s of diabetes.  Mother died around 2014/06/08 with multiorgan failure and septicemia with history of breast cancer and diabetes.  1 sister and 1 brother.   1 daughter with history of hypertension.  Her hemoglobin A1c is stable at 6.5% and was 7.3% in 06-08-2022.  She has a low HDL of 47 but total cholesterol, triglycerides and LDL cholesterol are all normal.  Her TSH is normal.  Her creatinine is stable at 1.71.  Glucose fasting is 134.    Review of Systems  Constitutional: Negative.   HENT: Negative.   Respiratory: Negative.   Cardiovascular: Negative.   Gastrointestinal: Negative.   Genitourinary: Negative.   Neurological: Negative.   Psychiatric/Behavioral:  Negative.        History of anxiety       Objective:   Physical Exam  Blood pressure 140/60, pulse 100, temperature 98 degrees orally pulse oximetry 99% weight 163 pounds height 5 feet 1 inches BMI 30.80  Skin warm and dry.  No cervical adenopathy.  TMs are clear.  Neck is supple.  No carotid bruits.  No thyromegaly.  Her chest is clear to auscultation without rales or wheezing.  Cardiac exam: Regular rate and rhythm normal S1 and S2 without murmurs or gallops.  Breast without masses.  Abdomen soft nondistended without hepatosplenomegaly masses or tenderness.  Bimanual exam is normal.  No lower extremity pitting edema.  Neuro intact without focal deficits.  Affect thought and judgment are normal.      Assessment & Plan:  Type 2 diabetes mellitus-stable and under good control with current regimen-insulin-dependent  Chronic kidney disease stage IIIa-stable  Hyperlipidemia-stable with statin medication  Low HDL  Essential hypertension-stable with current regimen  Anxiety treated with Xanax twice daily  Plan: She will continue with Benicar 40 mg daily, Lasix 40 mg daily and Caduet 10-40 daily.  She will take Xanax up to twice daily as needed for anxiety.  She is on NovoLog 7 units 3 times daily before meals and Lantus 50 units daily at 10 PM.  This works well for her.  No changes in her current regimen are needed.  Plan: She will return in 6  months.  Flu vaccine given today.  She has had 2 Covid vaccines already and should get the third 1 in the near future.  Her tetanus immunization is up-to-date.  She has had 2 pneumococcal vaccines.  Subjective:   Patient presents for Medicare Annual/Subsequent preventive examination.  Review Past Medical/Family/Social: See above   Risk Factors  Current exercise habits: Not a lot of physical exercise just housework Dietary issues discussed: Low-fat low carbohydrate  Cardiac risk factors: Diabetes  Depression Screen  (Note: if answer to  either of the following is "Yes", a more complete depression screening is indicated)   Over the past two weeks, have you felt down, depressed or hopeless? No  Over the past two weeks, have you felt little interest or pleasure in doing things? No Have you lost interest or pleasure in daily life? No Do you often feel hopeless? No Do you cry easily over simple problems? No   Activities of Daily Living  In your present state of health, do you have any difficulty performing the following activities?:   Driving? No  Managing money? No  Feeding yourself? No  Getting from bed to chair? No  Climbing a flight of stairs? No  Preparing food and eating?: No  Bathing or showering? No  Getting dressed: No  Getting to the toilet? No  Using the toilet:No  Moving around from place to place: Sometimes I am stiff In the past year have you fallen or had a near fall?:No  Are you sexually active? No  Do you have more than one partner? No   Hearing Difficulties: No  Do you often ask people to speak up or repeat themselves? No  Do you experience ringing or noises in your ears? No  Do you have difficulty understanding soft or whispered voices? No  Do you feel that you have a problem with memory? No Do you often misplace items? No    Home Safety:  Do you have a smoke alarm at your residence? Yes Do you have grab bars in the bathroom?  Yes Do you have throw rugs in your house?  Yes   Cognitive Testing  Alert? Yes Normal Appearance?Yes  Oriented to person? Yes Place? Yes  Time? Yes  Recall of three objects?  Not tested-gets anxious Can perform simple calculations?  Not tested-gets anxious Displays appropriate judgment?Yes  Can read the correct time from a watch face?Yes   List the Names of Other Physician/Practitioners you currently use:  See referral list for the physicians patient is currently seeing.  Dr. Randon Goldsmith office for eye exam   Review of Systems: See above   Objective:      General appearance: Appears stated age and mildly obese  Head: Normocephalic, without obvious abnormality, atraumatic  Eyes: conj clear, EOMi PEERLA  Ears: normal TM's and external ear canals both ears  Nose: Nares normal. Septum midline. Mucosa normal. No drainage or sinus tenderness.  Throat: lips, mucosa, and tongue normal; teeth and gums normal  Neck: no adenopathy, no carotid bruit, no JVD, supple, symmetrical, trachea midline and thyroid not enlarged, symmetric, no tenderness/mass/nodules  No CVA tenderness.  Lungs: clear to auscultation bilaterally  Breasts: normal appearance, no masses or tenderness Heart: regular rate and rhythm, S1, S2 normal, no murmur, click, rub or gallop  Abdomen: soft, non-tender; bowel sounds normal; no masses, no organomegaly  Musculoskeletal: ROM normal in all joints, no crepitus, no deformity, Normal muscle strengthen. Back  is symmetric, no curvature. Skin: Skin color, texture, turgor  normal. No rashes or lesions  Lymph nodes: Cervical, supraclavicular, and axillary nodes normal.  Neurologic: CN 2 -12 Normal, Normal symmetric reflexes. Normal coordination and gait  Psych: Alert & Oriented x 3, Mood appear stable.    Assessment:    Annual wellness medicare exam   Plan:    During the course of the visit the patient was educated and counseled about appropriate screening and preventive services including:   Has had diabetic eye exam.  No recent mammogram but no masses on breast exam  Flu vaccine received and has had 2 COVID-19 vaccines     Patient Instructions (the written plan) was given to the patient.  Medicare Attestation  I have personally reviewed:  The patient's medical and social history  Their use of alcohol, tobacco or illicit drugs  Their current medications and supplements  The patient's functional ability including ADLs,fall risks, home safety risks, cognitive, and hearing and visual impairment  Diet and physical  activities  Evidence for depression or mood disorders  The patient's weight, height, BMI, and visual acuity have been recorded in the chart. I have made referrals, counseling, and provided education to the patient based on review of the above and I have provided the patient with a written personalized care plan for preventive services.

## 2019-12-01 LAB — CBC WITH DIFFERENTIAL/PLATELET
Absolute Monocytes: 474 cells/uL (ref 200–950)
Basophils Absolute: 30 cells/uL (ref 0–200)
Basophils Relative: 0.5 %
Eosinophils Absolute: 60 cells/uL (ref 15–500)
Eosinophils Relative: 1 %
HCT: 39.4 % (ref 35.0–45.0)
Hemoglobin: 12.7 g/dL (ref 11.7–15.5)
Lymphs Abs: 1986 cells/uL (ref 850–3900)
MCH: 26.1 pg — ABNORMAL LOW (ref 27.0–33.0)
MCHC: 32.2 g/dL (ref 32.0–36.0)
MCV: 81.1 fL (ref 80.0–100.0)
MPV: 11.2 fL (ref 7.5–12.5)
Monocytes Relative: 7.9 %
Neutro Abs: 3450 cells/uL (ref 1500–7800)
Neutrophils Relative %: 57.5 %
Platelets: 355 10*3/uL (ref 140–400)
RBC: 4.86 10*6/uL (ref 3.80–5.10)
RDW: 14.5 % (ref 11.0–15.0)
Total Lymphocyte: 33.1 %
WBC: 6 10*3/uL (ref 3.8–10.8)

## 2019-12-01 LAB — COMPLETE METABOLIC PANEL WITH GFR
AG Ratio: 1.8 (calc) (ref 1.0–2.5)
ALT: 16 U/L (ref 6–29)
AST: 14 U/L (ref 10–35)
Albumin: 4.3 g/dL (ref 3.6–5.1)
Alkaline phosphatase (APISO): 82 U/L (ref 37–153)
BUN/Creatinine Ratio: 15 (calc) (ref 6–22)
BUN: 25 mg/dL (ref 7–25)
CO2: 24 mmol/L (ref 20–32)
Calcium: 9.6 mg/dL (ref 8.6–10.4)
Chloride: 103 mmol/L (ref 98–110)
Creat: 1.71 mg/dL — ABNORMAL HIGH (ref 0.60–0.93)
GFR, Est African American: 34 mL/min/{1.73_m2} — ABNORMAL LOW (ref >=60)
GFR, Est Non African American: 29 mL/min/{1.73_m2} — ABNORMAL LOW (ref >=60)
Globulin: 2.4 g/dL (calc) (ref 1.9–3.7)
Glucose, Bld: 134 mg/dL — ABNORMAL HIGH (ref 65–99)
Potassium: 4.3 mmol/L (ref 3.5–5.3)
Sodium: 137 mmol/L (ref 135–146)
Total Bilirubin: 0.8 mg/dL (ref 0.2–1.2)
Total Protein: 6.7 g/dL (ref 6.1–8.1)

## 2019-12-01 LAB — TSH: TSH: 2.52 mIU/L (ref 0.40–4.50)

## 2019-12-01 LAB — HEMOGLOBIN A1C W/OUT EAG: Hgb A1c MFr Bld: 6.5 % of total Hgb — ABNORMAL HIGH (ref <5.7)

## 2019-12-01 LAB — LIPID PANEL
Cholesterol: 150 mg/dL (ref <200)
HDL: 47 mg/dL — ABNORMAL LOW (ref >=50)
LDL Cholesterol (Calc): 84 mg/dL (calc)
Non-HDL Cholesterol (Calc): 103 mg/dL (calc) (ref <130)
Total CHOL/HDL Ratio: 3.2 (calc) (ref <5.0)
Triglycerides: 94 mg/dL (ref <150)

## 2019-12-01 LAB — TEST AUTHORIZATION

## 2019-12-01 NOTE — Patient Instructions (Addendum)
Flu vaccine given today.  It was a pleasure to see you today.  Continue current medications and follow-up in 6 months.  Have third Covid vaccine soon.

## 2019-12-06 ENCOUNTER — Other Ambulatory Visit: Payer: Self-pay | Admitting: Internal Medicine

## 2019-12-06 DIAGNOSIS — E109 Type 1 diabetes mellitus without complications: Secondary | ICD-10-CM

## 2019-12-31 ENCOUNTER — Other Ambulatory Visit: Payer: Self-pay | Admitting: Internal Medicine

## 2020-01-08 ENCOUNTER — Other Ambulatory Visit: Payer: Self-pay | Admitting: Internal Medicine

## 2020-01-08 DIAGNOSIS — E109 Type 1 diabetes mellitus without complications: Secondary | ICD-10-CM

## 2020-01-30 ENCOUNTER — Other Ambulatory Visit: Payer: Self-pay | Admitting: Internal Medicine

## 2020-01-30 DIAGNOSIS — E109 Type 1 diabetes mellitus without complications: Secondary | ICD-10-CM

## 2020-03-28 DIAGNOSIS — H04123 Dry eye syndrome of bilateral lacrimal glands: Secondary | ICD-10-CM | POA: Diagnosis not present

## 2020-03-28 DIAGNOSIS — H40013 Open angle with borderline findings, low risk, bilateral: Secondary | ICD-10-CM | POA: Diagnosis not present

## 2020-03-28 DIAGNOSIS — H1820 Unspecified corneal edema: Secondary | ICD-10-CM | POA: Diagnosis not present

## 2020-04-25 ENCOUNTER — Other Ambulatory Visit: Payer: Self-pay | Admitting: Internal Medicine

## 2020-05-27 ENCOUNTER — Other Ambulatory Visit: Payer: Self-pay

## 2020-05-27 ENCOUNTER — Other Ambulatory Visit: Payer: Medicare Other | Admitting: Internal Medicine

## 2020-05-27 DIAGNOSIS — Z79899 Other long term (current) drug therapy: Secondary | ICD-10-CM | POA: Diagnosis not present

## 2020-05-27 DIAGNOSIS — E119 Type 2 diabetes mellitus without complications: Secondary | ICD-10-CM | POA: Diagnosis not present

## 2020-05-27 DIAGNOSIS — E1169 Type 2 diabetes mellitus with other specified complication: Secondary | ICD-10-CM | POA: Diagnosis not present

## 2020-05-27 DIAGNOSIS — I10 Essential (primary) hypertension: Secondary | ICD-10-CM

## 2020-05-27 DIAGNOSIS — Z5181 Encounter for therapeutic drug level monitoring: Secondary | ICD-10-CM | POA: Diagnosis not present

## 2020-05-27 DIAGNOSIS — E785 Hyperlipidemia, unspecified: Secondary | ICD-10-CM | POA: Diagnosis not present

## 2020-05-28 LAB — BASIC METABOLIC PANEL
BUN/Creatinine Ratio: 19 (calc) (ref 6–22)
BUN: 37 mg/dL — ABNORMAL HIGH (ref 7–25)
CO2: 25 mmol/L (ref 20–32)
Calcium: 9.7 mg/dL (ref 8.6–10.4)
Chloride: 103 mmol/L (ref 98–110)
Creat: 1.97 mg/dL — ABNORMAL HIGH (ref 0.60–0.93)
Glucose, Bld: 104 mg/dL — ABNORMAL HIGH (ref 65–99)
Potassium: 4.7 mmol/L (ref 3.5–5.3)
Sodium: 137 mmol/L (ref 135–146)

## 2020-05-28 LAB — LIPID PANEL
Cholesterol: 161 mg/dL (ref <200)
HDL: 47 mg/dL — ABNORMAL LOW (ref >=50)
LDL Cholesterol (Calc): 95 mg/dL (calc)
Non-HDL Cholesterol (Calc): 114 mg/dL (calc) (ref <130)
Total CHOL/HDL Ratio: 3.4 (calc) (ref <5.0)
Triglycerides: 92 mg/dL (ref <150)

## 2020-05-28 LAB — HEMOGLOBIN A1C
Hgb A1c MFr Bld: 7.5 % of total Hgb — ABNORMAL HIGH (ref <5.7)
Mean Plasma Glucose: 169 mg/dL
eAG (mmol/L): 9.3 mmol/L

## 2020-05-30 ENCOUNTER — Encounter: Payer: Self-pay | Admitting: Internal Medicine

## 2020-05-30 ENCOUNTER — Ambulatory Visit (INDEPENDENT_AMBULATORY_CARE_PROVIDER_SITE_OTHER): Payer: Medicare Other | Admitting: Internal Medicine

## 2020-05-30 ENCOUNTER — Other Ambulatory Visit: Payer: Self-pay

## 2020-05-30 VITALS — BP 150/70 | HR 92 | Ht 61.0 in | Wt 167.0 lb

## 2020-05-30 DIAGNOSIS — F411 Generalized anxiety disorder: Secondary | ICD-10-CM | POA: Diagnosis not present

## 2020-05-30 DIAGNOSIS — R7989 Other specified abnormal findings of blood chemistry: Secondary | ICD-10-CM | POA: Diagnosis not present

## 2020-05-30 DIAGNOSIS — E1169 Type 2 diabetes mellitus with other specified complication: Secondary | ICD-10-CM

## 2020-05-30 DIAGNOSIS — H40013 Open angle with borderline findings, low risk, bilateral: Secondary | ICD-10-CM | POA: Diagnosis not present

## 2020-05-30 DIAGNOSIS — E785 Hyperlipidemia, unspecified: Secondary | ICD-10-CM | POA: Diagnosis not present

## 2020-05-30 DIAGNOSIS — N1831 Chronic kidney disease, stage 3a: Secondary | ICD-10-CM

## 2020-05-30 DIAGNOSIS — I10 Essential (primary) hypertension: Secondary | ICD-10-CM

## 2020-05-30 NOTE — Progress Notes (Signed)
   Subjective:    Patient ID: Erin [name], female    DOB: 12-28-1945, 75 y.o.   MRN: [id number]  HPI 75 year old Female seen for 6 month recheck.  Has a history of chronic kidney disease and diabetes mellitus as well as hypertension.  She had recent fasting lab work.  Hemoglobin A1c is stable at 7.5% and was 6.5% in October 2021.  Her lipid panel is normal with the exception of a low HDL at 47.  Her fasting glucose was 104 which is excellent however she has a history of chronic kidney disease and BUN is elevated at 1.97 and previously was 1.71 in 2021 and 1.49 in September 2020.  She will hydrate herself well and we will repeat her creatinine since it is elevated today.  Otherwise and her creatinine is still elevated when we repeat it, we are going to consider referral to [facility] for chronic kidney disease.  She will return at a future date for this lab.  She is on Lantus insulin and NovoLog.  She is on Benicar for hypertension-this may need to be discontinued if creatinine remains elevated.  Her blood pressure is 150/70 today pulse 92 and pulse oximetry 99%  She is also on Caduet 10-40 which is amlodipine and atorvastatin combination.  Review of Systems no new complaints.  Is a bit sad because this time of year is when her husband passed away.     Objective:   Physical Exam Blood pressure 150/70 pulse 92 and pulse oximetry 99% weight 167 pounds BMI 31.55  Skin is warm and dry.  No cervical adenopathy.  No carotid bruits.  Chest clear to auscultation.  Cardiac exam regular rate and rhythm.No significant edema of the lower extremities.       Assessment & Plan:  Essential hypertension-BP elevated today but she is anxious  Chronic kidney disease- needs to repeat creatinine when she is well-hydrated.  May need to stop Benicar if creatinine remains elevated  Anxiety  Hyperlipidemia-lipid panel stable  Plan: She will return when she is well-hydrated for a B-met and we  can consider what to do with her blood pressure medications and consider whether or not she needs to see Nephrology.  Benicar probably needs to be changed to something else like Coreg.  She is already on amlodipine with taking Caduet which contains a statin.  Addendum: Her creatinine is elevated. We will need to discontinue olmesartan, start Coreg 6.25 mg twice daily and follow up in 2 weeks. Will ask staff to call her daughter Erin [name] to explain what we are doing.

## 2020-05-30 NOTE — Patient Instructions (Addendum)
Basic metabolic panel rechecked today.   Creatinine is elevated with recent labs so this has been repeated.    Addendum:  Creatinine remains elevated. Take Coreg 6.25 mg twice a day . Stop olmesartan.  Follow up in 2 weeks.

## 2020-05-31 LAB — BASIC METABOLIC PANEL
BUN/Creatinine Ratio: 16 (calc) (ref 6–22)
BUN: 30 mg/dL — ABNORMAL HIGH (ref 7–25)
CO2: 23 mmol/L (ref 20–32)
Calcium: 10.2 mg/dL (ref 8.6–10.4)
Chloride: 105 mmol/L (ref 98–110)
Creat: 1.87 mg/dL — ABNORMAL HIGH (ref 0.60–0.93)
Glucose, Bld: 188 mg/dL — ABNORMAL HIGH (ref 65–99)
Potassium: 4.5 mmol/L (ref 3.5–5.3)
Sodium: 140 mmol/L (ref 135–146)

## 2020-06-02 ENCOUNTER — Other Ambulatory Visit: Payer: Self-pay

## 2020-06-02 MED ORDER — CARVEDILOL 6.25 MG PO TABS: 6.2500 mg | ORAL_TABLET | Freq: Two times a day (BID) | ORAL | 0 refills | Status: DC

## 2020-06-19 ENCOUNTER — Encounter: Payer: Self-pay | Admitting: Internal Medicine

## 2020-06-20 ENCOUNTER — Other Ambulatory Visit: Payer: Medicare Other | Admitting: Internal Medicine

## 2020-06-20 ENCOUNTER — Other Ambulatory Visit: Payer: Self-pay

## 2020-06-20 DIAGNOSIS — R7989 Other specified abnormal findings of blood chemistry: Secondary | ICD-10-CM

## 2020-06-20 LAB — BASIC METABOLIC PANEL
BUN/Creatinine Ratio: 12 (calc) (ref 6–22)
BUN: 19 mg/dL (ref 7–25)
CO2: 25 mmol/L (ref 20–32)
Calcium: 9.4 mg/dL (ref 8.6–10.4)
Chloride: 104 mmol/L (ref 98–110)
Creat: 1.57 mg/dL — ABNORMAL HIGH (ref 0.60–0.93)
Glucose, Bld: 116 mg/dL — ABNORMAL HIGH (ref 65–99)
Potassium: 4.8 mmol/L (ref 3.5–5.3)
Sodium: 139 mmol/L (ref 135–146)

## 2020-06-20 NOTE — Progress Notes (Signed)
Called patient, she was so excited

## 2020-06-23 ENCOUNTER — Ambulatory Visit: Payer: Medicare Other | Admitting: Internal Medicine

## 2020-07-07 ENCOUNTER — Other Ambulatory Visit: Payer: Self-pay | Admitting: Internal Medicine

## 2020-07-25 ENCOUNTER — Other Ambulatory Visit: Payer: Self-pay | Admitting: Internal Medicine

## 2020-08-08 ENCOUNTER — Other Ambulatory Visit: Payer: Self-pay | Admitting: Internal Medicine

## 2020-09-01 ENCOUNTER — Other Ambulatory Visit: Payer: Self-pay | Admitting: Internal Medicine

## 2020-09-11 ENCOUNTER — Other Ambulatory Visit: Payer: Self-pay | Admitting: Internal Medicine

## 2020-10-10 DIAGNOSIS — H04123 Dry eye syndrome of bilateral lacrimal glands: Secondary | ICD-10-CM | POA: Diagnosis not present

## 2020-10-10 DIAGNOSIS — H40013 Open angle with borderline findings, low risk, bilateral: Secondary | ICD-10-CM | POA: Diagnosis not present

## 2020-10-10 DIAGNOSIS — H18791 Other corneal deformities, right eye: Secondary | ICD-10-CM | POA: Diagnosis not present

## 2020-10-10 DIAGNOSIS — E119 Type 2 diabetes mellitus without complications: Secondary | ICD-10-CM | POA: Diagnosis not present

## 2020-10-18 ENCOUNTER — Other Ambulatory Visit: Payer: Self-pay | Admitting: Internal Medicine

## 2020-11-25 ENCOUNTER — Other Ambulatory Visit: Payer: Self-pay

## 2020-11-25 ENCOUNTER — Other Ambulatory Visit: Payer: Medicare Other | Admitting: Internal Medicine

## 2020-11-25 DIAGNOSIS — E1169 Type 2 diabetes mellitus with other specified complication: Secondary | ICD-10-CM

## 2020-11-25 DIAGNOSIS — I10 Essential (primary) hypertension: Secondary | ICD-10-CM | POA: Diagnosis not present

## 2020-11-25 DIAGNOSIS — Z1329 Encounter for screening for other suspected endocrine disorder: Secondary | ICD-10-CM | POA: Diagnosis not present

## 2020-11-25 DIAGNOSIS — E785 Hyperlipidemia, unspecified: Secondary | ICD-10-CM | POA: Diagnosis not present

## 2020-11-26 LAB — COMPLETE METABOLIC PANEL WITH GFR
AG Ratio: 2 (calc) (ref 1.0–2.5)
ALT: 20 U/L (ref 6–29)
AST: 16 U/L (ref 10–35)
Albumin: 4.3 g/dL (ref 3.6–5.1)
Alkaline phosphatase (APISO): 93 U/L (ref 37–153)
BUN/Creatinine Ratio: 12 (calc) (ref 6–22)
BUN: 17 mg/dL (ref 7–25)
CO2: 26 mmol/L (ref 20–32)
Calcium: 9.7 mg/dL (ref 8.6–10.4)
Chloride: 104 mmol/L (ref 98–110)
Creat: 1.41 mg/dL — ABNORMAL HIGH (ref 0.60–1.00)
Globulin: 2.1 g/dL (calc) (ref 1.9–3.7)
Glucose, Bld: 112 mg/dL — ABNORMAL HIGH (ref 65–99)
Potassium: 5.4 mmol/L — ABNORMAL HIGH (ref 3.5–5.3)
Sodium: 138 mmol/L (ref 135–146)
Total Bilirubin: 1 mg/dL (ref 0.2–1.2)
Total Protein: 6.4 g/dL (ref 6.1–8.1)
eGFR: 39 mL/min/{1.73_m2} — ABNORMAL LOW (ref >=60)

## 2020-11-26 LAB — CBC WITH DIFFERENTIAL/PLATELET
Absolute Monocytes: 616 cells/uL (ref 200–950)
Basophils Absolute: 31 cells/uL (ref 0–200)
Basophils Relative: 0.5 %
Eosinophils Absolute: 31 cells/uL (ref 15–500)
Eosinophils Relative: 0.5 %
HCT: 42.2 % (ref 35.0–45.0)
Hemoglobin: 13 g/dL (ref 11.7–15.5)
Lymphs Abs: 2147 cells/uL (ref 850–3900)
MCH: 24.9 pg — ABNORMAL LOW (ref 27.0–33.0)
MCHC: 30.8 g/dL — ABNORMAL LOW (ref 32.0–36.0)
MCV: 80.8 fL (ref 80.0–100.0)
MPV: 11.6 fL (ref 7.5–12.5)
Monocytes Relative: 10.1 %
Neutro Abs: 3276 cells/uL (ref 1500–7800)
Neutrophils Relative %: 53.7 %
Platelets: 348 10*3/uL (ref 140–400)
RBC: 5.22 10*6/uL — ABNORMAL HIGH (ref 3.80–5.10)
RDW: 14.6 % (ref 11.0–15.0)
Total Lymphocyte: 35.2 %
WBC: 6.1 10*3/uL (ref 3.8–10.8)

## 2020-11-26 LAB — LIPID PANEL
Cholesterol: 134 mg/dL (ref <200)
HDL: 40 mg/dL — ABNORMAL LOW (ref >=50)
LDL Cholesterol (Calc): 72 mg/dL (calc)
Non-HDL Cholesterol (Calc): 94 mg/dL (calc) (ref <130)
Total CHOL/HDL Ratio: 3.4 (calc) (ref <5.0)
Triglycerides: 132 mg/dL (ref <150)

## 2020-11-26 LAB — HEMOGLOBIN A1C
Hgb A1c MFr Bld: 6.7 % of total Hgb — ABNORMAL HIGH (ref <5.7)
Mean Plasma Glucose: 146 mg/dL
eAG (mmol/L): 8.1 mmol/L

## 2020-11-26 LAB — TSH: TSH: 3.01 mIU/L (ref 0.40–4.50)

## 2020-12-02 ENCOUNTER — Ambulatory Visit (INDEPENDENT_AMBULATORY_CARE_PROVIDER_SITE_OTHER): Payer: Medicare Other | Admitting: Internal Medicine

## 2020-12-02 ENCOUNTER — Encounter: Payer: Self-pay | Admitting: Internal Medicine

## 2020-12-02 ENCOUNTER — Other Ambulatory Visit: Payer: Self-pay

## 2020-12-02 VITALS — BP 170/86 | HR 67 | Temp 98.0°F | Ht 61.0 in | Wt 169.0 lb

## 2020-12-02 DIAGNOSIS — F411 Generalized anxiety disorder: Secondary | ICD-10-CM

## 2020-12-02 DIAGNOSIS — E1169 Type 2 diabetes mellitus with other specified complication: Secondary | ICD-10-CM

## 2020-12-02 DIAGNOSIS — Z23 Encounter for immunization: Secondary | ICD-10-CM | POA: Diagnosis not present

## 2020-12-02 DIAGNOSIS — N1832 Chronic kidney disease, stage 3b: Secondary | ICD-10-CM

## 2020-12-02 DIAGNOSIS — E785 Hyperlipidemia, unspecified: Secondary | ICD-10-CM | POA: Diagnosis not present

## 2020-12-02 DIAGNOSIS — R7989 Other specified abnormal findings of blood chemistry: Secondary | ICD-10-CM

## 2020-12-02 DIAGNOSIS — I10 Essential (primary) hypertension: Secondary | ICD-10-CM | POA: Diagnosis not present

## 2020-12-02 DIAGNOSIS — E8881 Metabolic syndrome: Secondary | ICD-10-CM

## 2020-12-02 DIAGNOSIS — Z0000 Encounter for general adult medical examination without abnormal findings: Secondary | ICD-10-CM | POA: Diagnosis not present

## 2020-12-02 LAB — POCT URINALYSIS DIP (CLINITEK)
Bilirubin, UA: NEGATIVE
Blood, UA: NEGATIVE
Glucose, UA: NEGATIVE mg/dL
Ketones, POC UA: NEGATIVE mg/dL
Leukocytes, UA: NEGATIVE
Nitrite, UA: NEGATIVE
POC PROTEIN,UA: NEGATIVE
Spec Grav, UA: 1.015 (ref 1.010–1.025)
Urobilinogen, UA: 0.2 E.U./dL
pH, UA: 5 (ref 5.0–8.0)

## 2020-12-02 NOTE — Patient Instructions (Addendum)
It was a pleasure to see you today.  Please have your daughter keep an eye on your blood pressure.  Would like to see you again in 4 to 6 weeks for nurse visit blood pressure check since it is elevated today.  Otherwise return in 1 year or as needed.  Flu vaccine given.

## 2020-12-02 NOTE — Progress Notes (Signed)
Subjective:    Patient ID: Erin [name], female    DOB: 21-Apr-1945, 75 y.o.   MRN: [id number]  HPI 75 year old Female for health maintenance exam, Medicare wellness and evaluation of medical issues.  Had eye exam by Dr. Posey Pronto in February 2022 with [facility]  History of corneal edema and elevated intraocular pressures.  Has thick corneas.  Findings apparently are borderline and felt to be low risk bilaterally.  She has a history of anxiety, chronic kidney disease stage IV, hyperlipidemia, hypertension insulin-dependent diabetes and metabolic syndrome.  She was admitted to the hospital in 05-27-2008 with renal failure.  At that time was in diabetic ketoacidosis with hyperosmolar state.  Serum glucose was 1212.  Serum creatinine was four-point and she was volume depleted.  Her creatinine never returned to normal after that.  She has chronic kidney disease which is stable.  Longstanding history of hypertension which was present prior to that admission.  Social history: She has 3 daughters.  She and her husband were married for some 25 years and he died of a massive MI in 27-May-2004.  She was born in [place] and grew up in [place].  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  She is now retired.  Does not smoke or consume alcohol.  Family history: Father died in his 68s of diabetes.  Mother died around 2014-05-28 with multiorgan failure and septicemia with history of breast cancer and diabetes.  1 brother and 1 sister.  1 daughter with history of hypertension.               Review of Systems she denies chest pain, shortness of breath, dyspnea on exertion, lower extremity edema.     Objective:   Physical Exam Blood pressure elevated 170/86 pulse 67 temperature 98 degrees pulse oximetry 99% weight 169 pounds height 5 feet 1 inches BMI 31.93  Skin: Warm and dry.  No cervical adenopathy.  TMs  clear.  Neck is supple.  Chest clear to auscultation.  No thyromegaly.  Cardiac exam: Regular rate and rhythm without ectopy.  Abdomen is soft nondistended without hepatosplenomegaly masses or tenderness.  No lower extremity pitting edema.  Affect thought and judgment are normal.  Neurological exam is intact without gross focal deficits.       Assessment & Plan:  Elevated blood pressure reading today-patient is anxious.  This needs follow-up  Longstanding history of hypertension  Chronic kidney disease stage 3 b  Low HDL  Essential hypertension longstanding  Anxiety treated with Xanax twice daily-this may be the cause of her elevated blood pressure reading  Plan: She will need follow-up in 4 to 6 weeks.  Subjective:   Patient presents for Medicare Annual/Subsequent preventive examination.  Review Past Medical/Family/Social: See above She drives without difficulty  Risk Factors  Current exercise habits: Not a lot of physical exercise. Dietary issues discussed: Low-fat low carbohydrate  Cardiac risk factors: Diabetes  Depression Screen  (Note: if answer to either of the following is "Yes", a more complete depression screening is indicated)   Over the past two weeks, have you felt down, depressed or hopeless? No  Over the past two weeks, have you felt little interest or pleasure in doing things? No Have you lost interest or pleasure in daily life? No Do you often feel hopeless? No Do you cry easily over simple problems? No   Activities of Daily  Living  In your present state of health, do you have any difficulty performing the following activities?:   Driving? No  Managing money? No  Feeding yourself? No  Getting from bed to chair? No  Climbing a flight of stairs? No  Preparing food and eating?: No  Bathing or showering? No  Getting dressed: No  Getting to the toilet? No  Using the toilet:No  Moving around from place to place: No  In the past year have you fallen or  had a near fall?:No  Are you sexually active? No  Do you have more than one partner? No   Hearing Difficulties: No  Do you often ask people to speak up or repeat themselves? No  Do you experience ringing or noises in your ears? No  Do you have difficulty understanding soft or whispered voices? No  Do you feel that you have a problem with memory? No Do you often misplace items? No    Home Safety:  Do you have a smoke alarm at your residence? Yes Do you have grab bars in the bathroom? Do you have throw rugs in your house?   Cognitive Testing  Alert? Yes Normal Appearance?Yes  Oriented to person? Yes Place? Yes  Time? Yes  Recall of three objects?  Not tested due to anxiety Can perform simple calculations?  Not tested due to anxiety Displays appropriate judgment?Yes  Can read the correct time from a watch face?Yes   List the Names of Other Physician/Practitioners you currently use:  See referral list for the physicians patient is currently seeing.  Ophthalmologist   Review of Systems: See above no new complaints   Objective:     General appearance: Appears stated age and mildly obese  Head: Normocephalic, without obvious abnormality, atraumatic  Eyes: conj clear, EOMi PEERLA  Ears: normal TM's and external ear canals both ears  Nose: Nares normal. Septum midline. Mucosa normal. No drainage or sinus tenderness.  Throat: lips, mucosa, and tongue normal; teeth and gums normal  Neck: no adenopathy, no carotid bruit, no JVD, supple, symmetrical, trachea midline and thyroid not enlarged, symmetric, no tenderness/mass/nodules  No CVA tenderness.  Lungs: clear to auscultation bilaterally  Breasts: normal appearance, no masses or tenderness    Heart: regular rate and rhythm, S1, S2 normal, no murmur, click, rub or gallop  Abdomen: soft, non-tender; bowel sounds normal; no masses, no organomegaly  Musculoskeletal: ROM normal in all joints, no crepitus, no deformity, Normal  muscle strengthen. Back  is symmetric, no curvature. Skin: Skin color, texture, turgor normal. No rashes or lesions  Lymph nodes: Cervical, supraclavicular, and axillary nodes normal.  Neurologic: CN 2 -12 Normal, Normal symmetric reflexes. Normal coordination and gait  Psych: Alert & Oriented x 3, Mood appear stable.    Assessment:    Annual wellness medicare exam   Plan:    During the course of the visit the patient was educated and counseled about appropriate screening and preventive services including:   See above     Patient Instructions (the written plan) was given to the patient.  Medicare Attestation  I have personally reviewed:  The patient's medical and social history  Their use of alcohol, tobacco or illicit drugs  Their current medications and supplements  The patient's functional ability including ADLs,fall risks, home safety risks, cognitive, and hearing and visual impairment  Diet and physical activities  Evidence for depression or mood disorders  The patient's weight, height, BMI, and visual acuity have been recorded in the  chart. I have made referrals, counseling, and provided education to the patient based on review of the above and I have provided the patient with a written personalized care plan for preventive services.   IMargaree Mackintosh, MD, have reviewed all documentation for this visit. The documentation on 02/02/21 for the exam, diagnosis, procedures, and orders are all accurate and complete.

## 2020-12-05 ENCOUNTER — Other Ambulatory Visit: Payer: Self-pay | Admitting: Internal Medicine

## 2020-12-05 DIAGNOSIS — Z1231 Encounter for screening mammogram for malignant neoplasm of breast: Secondary | ICD-10-CM

## 2021-01-14 ENCOUNTER — Other Ambulatory Visit: Payer: Self-pay | Admitting: Internal Medicine

## 2021-01-26 ENCOUNTER — Other Ambulatory Visit: Payer: Self-pay | Admitting: Internal Medicine

## 2021-02-02 ENCOUNTER — Encounter: Payer: Self-pay | Admitting: Internal Medicine

## 2021-02-03 ENCOUNTER — Telehealth: Payer: Self-pay | Admitting: Internal Medicine

## 2021-02-03 NOTE — Telephone Encounter (Signed)
LVM to CB to schedule OV to recheck BP and to give Hemoccult cards

## 2021-02-04 ENCOUNTER — Other Ambulatory Visit: Payer: Self-pay | Admitting: Internal Medicine

## 2021-02-23 ENCOUNTER — Other Ambulatory Visit: Payer: Self-pay | Admitting: Internal Medicine

## 2021-03-23 ENCOUNTER — Other Ambulatory Visit: Payer: Self-pay | Admitting: Internal Medicine

## 2021-04-02 ENCOUNTER — Telehealth: Payer: Self-pay | Admitting: Internal Medicine

## 2021-04-02 NOTE — Telephone Encounter (Signed)
LVM that we changed appt from 11:45 to 11:30 ?

## 2021-04-03 ENCOUNTER — Telehealth: Payer: Self-pay | Admitting: Internal Medicine

## 2021-04-03 NOTE — Telephone Encounter (Signed)
[facility]-[employer] Coordinator [facility] ? ?Erin [name] called to schedule an appointment for [facility] for her uncontrolled blood pressure. ?Appointment scheduled. ?

## 2021-04-09 ENCOUNTER — Ambulatory Visit: Payer: Medicare Other | Admitting: Internal Medicine

## 2021-06-02 ENCOUNTER — Other Ambulatory Visit: Payer: Medicare Other

## 2021-06-02 ENCOUNTER — Other Ambulatory Visit: Payer: Medicare Other | Admitting: Internal Medicine

## 2021-06-02 DIAGNOSIS — E119 Type 2 diabetes mellitus without complications: Secondary | ICD-10-CM

## 2021-06-02 DIAGNOSIS — R7989 Other specified abnormal findings of blood chemistry: Secondary | ICD-10-CM

## 2021-06-02 DIAGNOSIS — I10 Essential (primary) hypertension: Secondary | ICD-10-CM | POA: Diagnosis not present

## 2021-06-02 DIAGNOSIS — Z794 Long term (current) use of insulin: Secondary | ICD-10-CM | POA: Diagnosis not present

## 2021-06-03 LAB — BASIC METABOLIC PANEL
BUN/Creatinine Ratio: 13 (calc) (ref 6–22)
BUN: 17 mg/dL (ref 7–25)
CO2: 26 mmol/L (ref 20–32)
Calcium: 9.5 mg/dL (ref 8.6–10.4)
Chloride: 105 mmol/L (ref 98–110)
Creat: 1.34 mg/dL — ABNORMAL HIGH (ref 0.60–1.00)
Glucose, Bld: 145 mg/dL — ABNORMAL HIGH (ref 65–99)
Potassium: 4.8 mmol/L (ref 3.5–5.3)
Sodium: 139 mmol/L (ref 135–146)

## 2021-06-03 LAB — HEMOGLOBIN A1C
Hgb A1c MFr Bld: 8.8 % of total Hgb — ABNORMAL HIGH (ref <5.7)
Mean Plasma Glucose: 206 mg/dL
eAG (mmol/L): 11.4 mmol/L

## 2021-06-05 ENCOUNTER — Ambulatory Visit: Payer: Medicare Other | Admitting: Internal Medicine

## 2021-06-08 ENCOUNTER — Encounter: Payer: Self-pay | Admitting: Internal Medicine

## 2021-06-08 ENCOUNTER — Ambulatory Visit (INDEPENDENT_AMBULATORY_CARE_PROVIDER_SITE_OTHER): Payer: Medicare Other | Admitting: Internal Medicine

## 2021-06-08 VITALS — BP 150/80 | HR 72 | Temp 99.3°F | Resp 16 | Ht 62.0 in | Wt 176.8 lb

## 2021-06-08 DIAGNOSIS — Z794 Long term (current) use of insulin: Secondary | ICD-10-CM | POA: Diagnosis not present

## 2021-06-08 DIAGNOSIS — N1832 Chronic kidney disease, stage 3b: Secondary | ICD-10-CM

## 2021-06-08 DIAGNOSIS — E785 Hyperlipidemia, unspecified: Secondary | ICD-10-CM | POA: Diagnosis not present

## 2021-06-08 DIAGNOSIS — I10 Essential (primary) hypertension: Secondary | ICD-10-CM | POA: Diagnosis not present

## 2021-06-08 DIAGNOSIS — F411 Generalized anxiety disorder: Secondary | ICD-10-CM

## 2021-06-08 DIAGNOSIS — R7989 Other specified abnormal findings of blood chemistry: Secondary | ICD-10-CM | POA: Diagnosis not present

## 2021-06-08 DIAGNOSIS — E1169 Type 2 diabetes mellitus with other specified complication: Secondary | ICD-10-CM

## 2021-06-08 DIAGNOSIS — E119 Type 2 diabetes mellitus without complications: Secondary | ICD-10-CM | POA: Diagnosis not present

## 2021-06-08 NOTE — Progress Notes (Incomplete)
? ?  Subjective:  ? ? Patient ID: Erin [name], female    DOB: 05/11/1945, 76 y.o.   MRN: [id number] ? ?HPI ? ? ? ?Review of Systems ? ?   ?Objective:  ? Physical Exam ? ? ? ? ?   ?Assessment & Plan:  ? ? ?

## 2021-06-08 NOTE — Progress Notes (Signed)
   Subjective:    Patient ID: Erin [name], female    DOB: Aug 08, 1945, 76 y.o.   MRN: [id number]  HPI Pleasant 76 year old female seen today for follow-up.  She was here in May for health maintenance exam and Medicare wellness visit.  Has annual diabetic eye exam at [facility], Dr. Allena Katz.  History of elevated intraocular pressures.  History of anxiety, chronic kidney disease stage IV, hyperlipidemia, hypertension, insulin-dependent diabetes and metabolic syndrome.  Admitted to the hospital in 2010 with diabetic ketoacidosis and hyperosmolar state.  Serum glucose was 1212.  Creatinine was elevated and she was volume depleted.  Her creatinine never returned to normal after that.  Her chronic kidney disease is stable.  She is on Lantus insulin 50 units at 10 PM and NovoLog 7 units subcu 3 times a day before meals.  For hypertension she is on Coreg 6.25 mg twice daily, Caduet 10-40 daily and Lasix 40 mg daily.  Takes Xanax 1 mg twice daily if needed for anxiety.  Also on vitamin D supplement weekly.  Longstanding history of hypertension which was present prior to that admission to the hospital.  Her hemoglobin A1c is 8.8% which is concerning.  It was 6.7% in October.  Fasting glucose is 145.  Creatinine is stable at 1.34 and has been as high as 1.97 in April 2022.  Review of Systems see above     Objective:   Physical Exam Upon arrival her blood pressure was 172/80 and repeated was 150/80.  This was taken with a large cuff.  BMI 32.34 pulse 72 Weight is 176 pounds 12.8 ounces BMI 32.34  Skin: Warm and dry.  Nodes none.  No carotid bruits.  Chest clear.  Cardiac exam: Regular rate and rhythm without ectopy.  No lower extremity pitting edema.    Assessment & Plan:  Insulin-dependent diabetes-hemoglobin A1c is 8.8% which is concerning.  It was previously 6.7% October 2022.  Fasting glucose is 145.  Had diabetic eye exam June 6 at [facility]  Chronic kidney disease  stable with creatinine of 1.34  Anxiety state treated with Xanax twice daily  BMI 32.34-needs to work on diet.  Does not exercise all that much other than housework.  History of vitamin D deficiency-treated with weekly vitamin D supplement  Essential hypertension-blood pressure repeated today after resting some in the office and it decreased from 172/80 to 150/80.  Still her control here is not ideal and she needs to watch it at home and let me know if persistently elevated.  Plan: We will follow-up with the patient in the fall.  If she continues with these multiple issues I think it would be time to send her to Nephrology for evaluation.  She says she wants a little bit of time to do better with her diet.  We will see her again in the fall.

## 2021-06-09 LAB — MICROALBUMIN / CREATININE URINE RATIO
Creatinine, Urine: 146 mg/dL (ref 20–275)
Microalb Creat Ratio: 6 mcg/mg creat (ref <30)
Microalb, Ur: 0.9 mg/dL

## 2021-06-09 LAB — TEST AUTHORIZATION: TEST CODE:: 6517

## 2021-06-22 ENCOUNTER — Other Ambulatory Visit: Payer: Self-pay | Admitting: Internal Medicine

## 2021-07-07 DIAGNOSIS — H1789 Other corneal scars and opacities: Secondary | ICD-10-CM | POA: Diagnosis not present

## 2021-07-07 DIAGNOSIS — H18791 Other corneal deformities, right eye: Secondary | ICD-10-CM | POA: Diagnosis not present

## 2021-07-07 DIAGNOSIS — H40053 Ocular hypertension, bilateral: Secondary | ICD-10-CM | POA: Diagnosis not present

## 2021-07-07 DIAGNOSIS — H35033 Hypertensive retinopathy, bilateral: Secondary | ICD-10-CM | POA: Diagnosis not present

## 2021-07-07 DIAGNOSIS — H40013 Open angle with borderline findings, low risk, bilateral: Secondary | ICD-10-CM | POA: Diagnosis not present

## 2021-07-07 LAB — HM DIABETES EYE EXAM

## 2021-07-27 ENCOUNTER — Other Ambulatory Visit: Payer: Self-pay | Admitting: Internal Medicine

## 2021-08-27 ENCOUNTER — Other Ambulatory Visit: Payer: Self-pay | Admitting: Internal Medicine

## 2021-09-01 ENCOUNTER — Other Ambulatory Visit: Payer: Self-pay | Admitting: *Deleted

## 2021-09-01 NOTE — Patient Outreach (Signed)
  Care Coordination   Initial Visit Note   09/01/2021 Name: TERRESSA EVOLA MRN: [id number] DOB: Mar 20, 1945  MONTANNA MCBAIN is a 76 y.o. year old female who sees Baxley, Luanna Cole, MD for primary care. I  left voicemail message     SDOH assessments and interventions completed:   No   Care Coordination Interventions Activated:  No Encounter Outcome:  No Answer 1st attempt  Gean Maidens BSN RN [facility] Care Management [phone]

## 2021-09-14 ENCOUNTER — Other Ambulatory Visit: Payer: Self-pay | Admitting: Internal Medicine

## 2021-10-07 ENCOUNTER — Ambulatory Visit (INDEPENDENT_AMBULATORY_CARE_PROVIDER_SITE_OTHER): Payer: Medicare Other | Admitting: Internal Medicine

## 2021-10-07 VITALS — BP 144/80 | Temp 98.0°F

## 2021-10-07 DIAGNOSIS — Z23 Encounter for immunization: Secondary | ICD-10-CM | POA: Diagnosis not present

## 2021-11-16 ENCOUNTER — Other Ambulatory Visit: Payer: Self-pay | Admitting: Internal Medicine

## 2021-11-30 ENCOUNTER — Other Ambulatory Visit: Payer: Self-pay | Admitting: Internal Medicine

## 2021-12-22 ENCOUNTER — Other Ambulatory Visit: Payer: Medicare Other

## 2021-12-22 DIAGNOSIS — R5383 Other fatigue: Secondary | ICD-10-CM | POA: Diagnosis not present

## 2021-12-22 DIAGNOSIS — R7989 Other specified abnormal findings of blood chemistry: Secondary | ICD-10-CM

## 2021-12-22 DIAGNOSIS — I10 Essential (primary) hypertension: Secondary | ICD-10-CM | POA: Diagnosis not present

## 2021-12-22 DIAGNOSIS — E785 Hyperlipidemia, unspecified: Secondary | ICD-10-CM

## 2021-12-23 LAB — CBC WITH DIFFERENTIAL/PLATELET
Absolute Monocytes: 525 cells/uL (ref 200–950)
Basophils Absolute: 30 cells/uL (ref 0–200)
Basophils Relative: 0.5 %
Eosinophils Absolute: 30 cells/uL (ref 15–500)
Eosinophils Relative: 0.5 %
HCT: 39.3 % (ref 35.0–45.0)
Hemoglobin: 12.4 g/dL (ref 11.7–15.5)
Lymphs Abs: 2207 cells/uL (ref 850–3900)
MCH: 25.2 pg — ABNORMAL LOW (ref 27.0–33.0)
MCHC: 31.6 g/dL — ABNORMAL LOW (ref 32.0–36.0)
MCV: 79.7 fL — ABNORMAL LOW (ref 80.0–100.0)
MPV: 11.3 fL (ref 7.5–12.5)
Monocytes Relative: 8.9 %
Neutro Abs: 3109 cells/uL (ref 1500–7800)
Neutrophils Relative %: 52.7 %
Platelets: 326 10*3/uL (ref 140–400)
RBC: 4.93 10*6/uL (ref 3.80–5.10)
RDW: 14.7 % (ref 11.0–15.0)
Total Lymphocyte: 37.4 %
WBC: 5.9 10*3/uL (ref 3.8–10.8)

## 2021-12-23 LAB — LIPID PANEL
Cholesterol: 146 mg/dL (ref <200)
HDL: 48 mg/dL — ABNORMAL LOW (ref >=50)
LDL Cholesterol (Calc): 80 mg/dL (calc)
Non-HDL Cholesterol (Calc): 98 mg/dL (calc) (ref <130)
Total CHOL/HDL Ratio: 3 (calc) (ref <5.0)
Triglycerides: 94 mg/dL (ref <150)

## 2021-12-23 LAB — COMPLETE METABOLIC PANEL WITH GFR
AG Ratio: 1.8 (calc) (ref 1.0–2.5)
ALT: 16 U/L (ref 6–29)
AST: 13 U/L (ref 10–35)
Albumin: 4.4 g/dL (ref 3.6–5.1)
Alkaline phosphatase (APISO): 96 U/L (ref 37–153)
BUN/Creatinine Ratio: 14 (calc) (ref 6–22)
BUN: 24 mg/dL (ref 7–25)
CO2: 27 mmol/L (ref 20–32)
Calcium: 9.6 mg/dL (ref 8.6–10.4)
Chloride: 102 mmol/L (ref 98–110)
Creat: 1.73 mg/dL — ABNORMAL HIGH (ref 0.60–1.00)
Globulin: 2.5 g/dL (calc) (ref 1.9–3.7)
Glucose, Bld: 192 mg/dL — ABNORMAL HIGH (ref 65–99)
Potassium: 4.8 mmol/L (ref 3.5–5.3)
Sodium: 138 mmol/L (ref 135–146)
Total Bilirubin: 0.7 mg/dL (ref 0.2–1.2)
Total Protein: 6.9 g/dL (ref 6.1–8.1)
eGFR: 30 mL/min/{1.73_m2} — ABNORMAL LOW (ref >=60)

## 2021-12-23 LAB — TSH: TSH: 2.38 mIU/L (ref 0.40–4.50)

## 2021-12-29 ENCOUNTER — Encounter: Payer: Self-pay | Admitting: Internal Medicine

## 2021-12-29 ENCOUNTER — Ambulatory Visit (INDEPENDENT_AMBULATORY_CARE_PROVIDER_SITE_OTHER): Payer: Medicare Other | Admitting: Internal Medicine

## 2021-12-29 VITALS — BP 144/60 | HR 75 | Temp 98.6°F | Ht 62.0 in | Wt 174.0 lb

## 2021-12-29 DIAGNOSIS — F411 Generalized anxiety disorder: Secondary | ICD-10-CM

## 2021-12-29 DIAGNOSIS — E1169 Type 2 diabetes mellitus with other specified complication: Secondary | ICD-10-CM | POA: Diagnosis not present

## 2021-12-29 DIAGNOSIS — Z7185 Encounter for immunization safety counseling: Secondary | ICD-10-CM | POA: Diagnosis not present

## 2021-12-29 DIAGNOSIS — Z794 Long term (current) use of insulin: Secondary | ICD-10-CM | POA: Diagnosis not present

## 2021-12-29 DIAGNOSIS — Z0000 Encounter for general adult medical examination without abnormal findings: Secondary | ICD-10-CM | POA: Diagnosis not present

## 2021-12-29 DIAGNOSIS — E786 Lipoprotein deficiency: Secondary | ICD-10-CM

## 2021-12-29 DIAGNOSIS — E119 Type 2 diabetes mellitus without complications: Secondary | ICD-10-CM | POA: Diagnosis not present

## 2021-12-29 DIAGNOSIS — E785 Hyperlipidemia, unspecified: Secondary | ICD-10-CM

## 2021-12-29 DIAGNOSIS — Z23 Encounter for immunization: Secondary | ICD-10-CM

## 2021-12-29 DIAGNOSIS — I10 Essential (primary) hypertension: Secondary | ICD-10-CM

## 2021-12-29 DIAGNOSIS — Z1211 Encounter for screening for malignant neoplasm of colon: Secondary | ICD-10-CM

## 2021-12-29 DIAGNOSIS — N1832 Chronic kidney disease, stage 3b: Secondary | ICD-10-CM | POA: Diagnosis not present

## 2021-12-29 LAB — POCT URINALYSIS DIPSTICK
Bilirubin, UA: NEGATIVE
Blood, UA: NEGATIVE
Glucose, UA: NEGATIVE
Ketones, UA: NEGATIVE
Leukocytes, UA: NEGATIVE
Nitrite, UA: NEGATIVE
Protein, UA: NEGATIVE
Spec Grav, UA: 1.015 (ref 1.010–1.025)
Urobilinogen, UA: 0.2 E.U./dL
pH, UA: 5 (ref 5.0–8.0)

## 2021-12-29 NOTE — Progress Notes (Addendum)
Annual Wellness Visit     Patient: Erin [name], Female    DOB: [birth year], 76 y.o.   MRN: [id number] Visit Date: 12/29/2021  Chief Complaint  Patient presents with   Annual Exam   Subjective    Erin [name] is a 76 y.o. female who presents today for her Annual Wellness Visit.  HPI She is also here for health maintenance exam and evaluation of medical issues.  She has a history of anxiety, chronic kidney disease stage IV, hyperlipidemia, hypertension, insulin-dependent diabetes and metabolic syndrome.  Has annual diabetic eye exam at [facility].  History of corneal edema and elevated intraocular pressures.  Has thick corneas.  Findings apparently are borderline and felt to be low risk bilaterally.  She was admitted to the hospital in 05/20/08 with renal failure.  At that time she was in diabetic ketoacidosis with hyperosmolar state.  Serum glucose was 1212.  Serum creatinine was 4 and she was volume depleted.  Creatinine never returned to normal after that.  She has chronic kidney disease which is stable.  Longstanding history of hypertension which was present prior to that admission.  Social history: She has 3 daughters.  She and her husband were married for some 25 years and he died of a massive MI in 2004-05-20.  She was born in [place] and grew up in [place].  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        Review of Systems no new complaints. Has anxiety for which she takes Xanax. HTN is labile with level of anxiety   Objective    Vitals: BP (!) 144/60   Pulse 75   Temp 98.6 F (37 C) (Tympanic)   Ht 5\' 2"  (1.575 m)   Wt 174 lb (78.9 kg)   SpO2 99%   BMI 31.83 kg/m   Physical Exam  Skin: Warm and dry.  No cervical adenopathy or thyromegaly.  No carotid bruits.  Chest clear.  Breast are without masses.  Cardiac exam: Regular rate and rhythm without ectopy.   Abdomen soft nondistended without hepatosplenomegaly masses or tenderness.  No pitting edema of the lower extremities.  Brief neurological exam intact without gross focal deficits.  Affect is slightly anxious.  Thought process is normal.  Most recent functional status assessment:    12/29/2021   11:09 AM  In your present state of health, do you have any difficulty performing the following activities:  Hearing? 0  Vision? 0  Difficulty concentrating or making decisions? 0  Walking or climbing stairs? 0  Doing errands, shopping? 0  Preparing Food and eating ? N  Using the Toilet? N  In the past six months, have you accidently leaked urine? N  Do you have problems with loss of bowel control? N  Managing your Medications? N  Managing your Finances? N  Housekeeping or managing your Housekeeping? N   Most recent fall risk assessment:    12/29/2021   11:08 AM  Fall Risk   Falls in the past year? 0  Number falls in past yr: 0  Injury with Fall? 0  Risk for fall due to : No Fall Risks  Follow up Falls evaluation completed    Most recent depression screenings:    12/29/2021   11:08 AM 12/02/2020   11:11 AM  PHQ 2/9 Scores  PHQ - 2 Score 0 2  PHQ- 9 Score  2   Most recent cognitive screening:    12/29/2021   11:10 AM  6CIT Screen  What Year? 0 points  What month? 0 points  Count back from 20 0 points  Months in reverse 2 points  Repeat phrase 0 points       Assessment & Plan   Essential hypertension  Anxiety  Chronic kidney disease stage IIIb  Low HDL  Type 2 diabetes mellitus-hemoglobin A1c improved from a at which time it was 8.8% and is now 7.4%  Hyperlipidemia  Plan: She will continue to try to watch her diet and get some light exercise.  No change in current medications.  Return in 6 months.       Annual wellness visit done today including the all of the following: Reviewed patient's Family Medical History Reviewed and updated list of patient's  medical providers Assessment of cognitive impairment was done Assessed patient's functional ability Established a written schedule for health screening services Health Risk Assessent Completed and Reviewed  Discussed health benefits of physical activity, and encouraged her to engage in regular exercise appropriate for her age and condition.         IMargaree Mackintosh, MD, have reviewed all documentation for this visit. The documentation on 01/27/22 for the exam, diagnosis, procedures, and orders are all accurate and complete.   LaVon Philipp Deputy, CMA

## 2021-12-29 NOTE — Patient Instructions (Addendum)
Labs repeated including creatinine. Handicapped parking permit signed. Cologard ordered.pneumococcal vaccine given. RTC in 6 months

## 2021-12-30 LAB — CREATININE, SERUM: Creat: 1.52 mg/dL — ABNORMAL HIGH (ref 0.60–1.00)

## 2021-12-30 LAB — HEMOGLOBIN A1C
Hgb A1c MFr Bld: 7.4 % of total Hgb — ABNORMAL HIGH (ref <5.7)
Mean Plasma Glucose: 166 mg/dL
eAG (mmol/L): 9.2 mmol/L

## 2021-12-30 LAB — BUN: BUN: 22 mg/dL (ref 7–25)

## 2021-12-31 ENCOUNTER — Encounter: Payer: Self-pay | Admitting: *Deleted

## 2021-12-31 NOTE — Progress Notes (Signed)
[facility] Quality Team Note  Name: DIMOND CROTTY Date of Birth: Sep 09, 1945 MRN: [id number] Date: 12/31/2021  [facility] Quality Team has reviewed this patient's chart, please see recommendations below:  [facility] Quality Other; (Pt needs blood pressure recheck before end of 2023.  If compliant, would be able to close open gap.  )

## 2021-12-31 NOTE — Progress Notes (Signed)
Attempted to call patient phone rang no voicemail

## 2021-12-31 NOTE — Progress Notes (Signed)
Scheduled

## 2022-01-06 ENCOUNTER — Ambulatory Visit (INDEPENDENT_AMBULATORY_CARE_PROVIDER_SITE_OTHER): Payer: Medicare Other | Admitting: Internal Medicine

## 2022-01-06 VITALS — BP 130/68 | HR 68 | Temp 98.9°F | Ht 62.0 in | Wt 174.8 lb

## 2022-01-06 DIAGNOSIS — I10 Essential (primary) hypertension: Secondary | ICD-10-CM | POA: Diagnosis not present

## 2022-01-06 DIAGNOSIS — E119 Type 2 diabetes mellitus without complications: Secondary | ICD-10-CM

## 2022-01-06 DIAGNOSIS — E1169 Type 2 diabetes mellitus with other specified complication: Secondary | ICD-10-CM

## 2022-01-06 DIAGNOSIS — R5383 Other fatigue: Secondary | ICD-10-CM

## 2022-01-06 DIAGNOSIS — Z794 Long term (current) use of insulin: Secondary | ICD-10-CM

## 2022-01-06 DIAGNOSIS — N1832 Chronic kidney disease, stage 3b: Secondary | ICD-10-CM

## 2022-01-06 DIAGNOSIS — E785 Hyperlipidemia, unspecified: Secondary | ICD-10-CM | POA: Diagnosis not present

## 2022-01-06 NOTE — Progress Notes (Signed)
   Subjective:    Patient ID: Erin [name], female    DOB: [birth year], 76 y.o.   MRN: [id number]  HPI Here today for BP follow up. Also for discussion of fatigue and diabetic control. Hgb AIC in late November was 7.4 % and had been 8.8% in May. Her creatinine is better with hydration. Was 1.73  and now 1.52. Continue to monitor. No glucosuria. Has not done Cologard.has anxiety with BP checks oand doctor's visits. Hgb AIC 7.4% and improved from May 2023. Best Hgb AIC was 6.5% October 2021.Uribe dipstick in November was normal.  She is upset and concerned as to why she had to return for BP check and I explained to her [facility] asked for this before the end of the year.  Review of Systems no new complaints     Objective:   Physical Exam   BP 140/66 on arrival but improved to 130/68 after sitting in office for a few minutes.   Weight 174 lb 12.8 oz BMI 31.97       Assessment & Plan:   Essential HTN  CKD stage 3b  Anxiety state  Insulin dep DM  Plan: Bp is improved. RTC in 6 months

## 2022-01-26 NOTE — Patient Instructions (Addendum)
Her BP is stable. We will see her in 6 months. She has an element of office hypertension and anxiety.

## 2022-01-27 NOTE — Progress Notes (Deleted)
IMargaree Mackintosh, MD, have reviewed all documentation for this visit. The documentation on 01/27/22 for the exam, diagnosis, procedures, and orders are all accurate and complete.

## 2022-01-29 ENCOUNTER — Other Ambulatory Visit: Payer: Self-pay | Admitting: Internal Medicine

## 2022-01-30 NOTE — Addendum Note (Signed)
Addended by: Margaree Mackintosh on: 01/30/2022 10:34 AM   Modules accepted: Level of Service

## 2022-02-11 ENCOUNTER — Other Ambulatory Visit: Payer: Self-pay | Admitting: Internal Medicine

## 2022-02-22 ENCOUNTER — Other Ambulatory Visit: Payer: Self-pay | Admitting: Internal Medicine

## 2022-03-18 ENCOUNTER — Other Ambulatory Visit: Payer: Self-pay | Admitting: Internal Medicine

## 2022-03-24 ENCOUNTER — Other Ambulatory Visit: Payer: Self-pay | Admitting: Internal Medicine

## 2022-04-12 ENCOUNTER — Other Ambulatory Visit: Payer: Self-pay | Admitting: Internal Medicine

## 2022-04-15 ENCOUNTER — Other Ambulatory Visit: Payer: Self-pay | Admitting: Internal Medicine

## 2022-05-07 ENCOUNTER — Other Ambulatory Visit: Payer: Self-pay | Admitting: Internal Medicine

## 2022-05-10 ENCOUNTER — Other Ambulatory Visit: Payer: Self-pay | Admitting: Internal Medicine

## 2022-05-17 ENCOUNTER — Other Ambulatory Visit: Payer: Self-pay | Admitting: Internal Medicine

## 2022-05-19 ENCOUNTER — Other Ambulatory Visit: Payer: Self-pay | Admitting: Internal Medicine

## 2022-06-17 ENCOUNTER — Other Ambulatory Visit: Payer: Self-pay | Admitting: Internal Medicine

## 2022-06-30 NOTE — Progress Notes (Signed)
Patient Care Team: Margaree Mackintosh, MD as PCP - General (Internal Medicine) Jimmey Ralph Forest Becker, DO (Osteopathic Medicine) Mat Carne, DO (Optometry)  Visit Date: 07/08/22  Subjective:    Patient ID: Erin [name] , Female   DOB: 1945/10/31, 77 y.o.    MRN: [id number]   77 y.o. Female presents today for a 6 month follow-up.  Longstanding history of hypertension treated with carvedilol 6.25 mg twice daily with a meal. Blood pressure elevated today at 140/68. History of chronic kidney disease.  History of impaired glucose tolerance. HGBA1c at 8.0% on 07/06/22, up from 7.4% on 12/29/21. She is not interested in seeing an endocrinologist at this time.  Glucose elevated at 125. Creatinine elevated at 1.42. GFR low at 38. RBC elevated at 5.22. MCV low at 78.2. MCH at 24.1. MCHC at 30.9. HDL low at 42.  Social history: She has 3 daughters.  She and her husband were married for some 25 years and he died of a massive MI in 07-28-04.  She was born in [place] and grew up in [place].  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  Past Medical History:  Diagnosis Date   Chronic kidney disease    Diabetes mellitus    IDDM   Hyperlipidemia    Hypertension      Family History  Problem Relation Age of Onset   Cancer Mother    Diabetes Mother     Social Hx: She is a widow. Retired. One daughter resides with her. Total of 3 daughters.     Review of Systems  Constitutional:  Negative for fever and malaise/fatigue.  HENT:  Negative for congestion.   Eyes:  Negative for blurred vision.  Respiratory:  Negative for cough and shortness of breath.   Cardiovascular:  Negative for chest pain, palpitations and leg swelling.  Gastrointestinal:  Negative for vomiting.  Musculoskeletal:  Negative for back pain.  Skin:  Negative for rash.  Neurological:  Negative for loss of consciousness and headaches.         Objective:   Vitals: BP (!) 142/68   Pulse 85   Temp 99.2 F (37.3 C) (Tympanic)   Resp 16   Ht 5\' 2"  (1.575 m)   Wt 173 lb 4 oz (78.6 kg)   SpO2 98%   BMI 31.69 kg/m    Physical Exam Vitals and nursing note reviewed.  Constitutional:      General: She is not in acute distress.    Appearance: Normal appearance. She is not toxic-appearing.  HENT:     Head: Normocephalic and atraumatic.  Neck:     Thyroid: No thyroid mass, thyromegaly or thyroid tenderness.     Vascular: No carotid bruit.  Cardiovascular:     Rate and Rhythm: Normal rate and regular rhythm. No extrasystoles are present.    Pulses: Normal pulses.     Heart sounds: Normal heart sounds. No murmur heard.    No friction rub. No gallop.  Pulmonary:     Effort: Pulmonary effort is normal. No respiratory distress.     Breath sounds: Normal breath sounds. No wheezing or rales.  Lymphadenopathy:     Cervical: No cervical adenopathy.  Skin:    General: Skin is warm and dry.  Neurological:     Mental Status: She is alert and oriented to person, place, and time. Mental status is at baseline.  Psychiatric:  Mood and Affect: Mood normal.        Behavior: Behavior normal.        Thought Content: Thought content normal.        Judgment: Judgment normal.       Results:   Studies obtained and personally reviewed by me:   Labs:       Component Value Date/Time   NA 140 07/06/2022 0910   K 4.9 07/06/2022 0910   CL 104 07/06/2022 0910   CO2 27 07/06/2022 0910   GLUCOSE 125 (H) 07/06/2022 0910   BUN 20 07/06/2022 0910   CREATININE 1.42 (H) 07/06/2022 0910   CALCIUM 9.8 07/06/2022 0910   CALCIUM 7.8 (L) 04/01/2008 0455   PROT 6.7 07/06/2022 0910   ALBUMIN 4.3 04/20/2016 1145   AST 15 07/06/2022 0910   ALT 17 07/06/2022 0910   ALKPHOS 84 04/20/2016 1145   BILITOT 0.8 07/06/2022 0910   GFRNONAA 29 (L) 11/27/2019 0917   GFRAA 34 (L) 11/27/2019 0917     Lab Results  Component Value Date    WBC 6.8 07/06/2022   HGB 12.6 07/06/2022   HCT 40.8 07/06/2022   MCV 78.2 (L) 07/06/2022   PLT 370 07/06/2022    Lab Results  Component Value Date   CHOL 151 07/06/2022   HDL 42 (L) 07/06/2022   LDLCALC 85 07/06/2022   TRIG 139 07/06/2022   CHOLHDL 3.6 07/06/2022    Lab Results  Component Value Date   HGBA1C 8.0 (H) 07/06/2022     Lab Results  Component Value Date   TSH 2.38 12/22/2021      Assessment & Plan:   Hypertension: treated with carvedilol 6.25 mg twice daily with a meal, Lasix and Caduet. Blood pressure elevated today at 140/68.  Impaired glucose tolerance: HGBA1c at 8.0% on 07/06/22, up from 7.4% on 12/29/21. She is not interested in seeing an Endocrinologist at this time. Needs to walk for exercise and watch diet.  Return in 6 months for health maintenance exam, Medicare wellness and fasting labs. [facility] nurse may be of help to her.    I,Alexander Ruley,acting as a Neurosurgeon for Margaree Mackintosh, MD.,have documented all relevant documentation on the behalf of Margaree Mackintosh, MD,as directed by  Margaree Mackintosh, MD while in the presence of Margaree Mackintosh, MD.   I, Margaree Mackintosh, MD, have reviewed all documentation for this visit. The documentation on 07/21/22 for the exam, diagnosis, procedures, and orders are all accurate and complete.

## 2022-07-06 ENCOUNTER — Other Ambulatory Visit: Payer: Medicare Other

## 2022-07-06 DIAGNOSIS — I10 Essential (primary) hypertension: Secondary | ICD-10-CM | POA: Diagnosis not present

## 2022-07-06 DIAGNOSIS — Z794 Long term (current) use of insulin: Secondary | ICD-10-CM | POA: Diagnosis not present

## 2022-07-06 DIAGNOSIS — E785 Hyperlipidemia, unspecified: Secondary | ICD-10-CM | POA: Diagnosis not present

## 2022-07-06 DIAGNOSIS — E1169 Type 2 diabetes mellitus with other specified complication: Secondary | ICD-10-CM | POA: Diagnosis not present

## 2022-07-06 DIAGNOSIS — N1832 Chronic kidney disease, stage 3b: Secondary | ICD-10-CM

## 2022-07-06 DIAGNOSIS — E119 Type 2 diabetes mellitus without complications: Secondary | ICD-10-CM

## 2022-07-06 LAB — COMPLETE METABOLIC PANEL WITH GFR
ALT: 17 U/L (ref 6–29)
Albumin: 4.3 g/dL (ref 3.6–5.1)
Calcium: 9.8 mg/dL (ref 8.6–10.4)
Sodium: 140 mmol/L (ref 135–146)
Total Bilirubin: 0.8 mg/dL (ref 0.2–1.2)

## 2022-07-06 LAB — CBC WITH DIFFERENTIAL/PLATELET
Basophils Absolute: 27 cells/uL (ref 0–200)
HCT: 40.8 % (ref 35.0–45.0)
MCH: 24.1 pg — ABNORMAL LOW (ref 27.0–33.0)
Monocytes Relative: 9 %
RBC: 5.22 10*6/uL — ABNORMAL HIGH (ref 3.80–5.10)
RDW: 15 % (ref 11.0–15.0)

## 2022-07-06 LAB — LIPID PANEL
Cholesterol: 151 mg/dL (ref <200)
Non-HDL Cholesterol (Calc): 109 mg/dL (calc) (ref <130)

## 2022-07-07 LAB — HEMOGLOBIN A1C
Hgb A1c MFr Bld: 8 % of total Hgb — ABNORMAL HIGH (ref <5.7)
Mean Plasma Glucose: 183 mg/dL
eAG (mmol/L): 10.1 mmol/L

## 2022-07-07 LAB — COMPLETE METABOLIC PANEL WITH GFR
AG Ratio: 1.8 (calc) (ref 1.0–2.5)
AST: 15 U/L (ref 10–35)
Alkaline phosphatase (APISO): 110 U/L (ref 37–153)
BUN/Creatinine Ratio: 14 (calc) (ref 6–22)
BUN: 20 mg/dL (ref 7–25)
CO2: 27 mmol/L (ref 20–32)
Chloride: 104 mmol/L (ref 98–110)
Creat: 1.42 mg/dL — ABNORMAL HIGH (ref 0.60–1.00)
Globulin: 2.4 g/dL (calc) (ref 1.9–3.7)
Glucose, Bld: 125 mg/dL — ABNORMAL HIGH (ref 65–99)
Potassium: 4.9 mmol/L (ref 3.5–5.3)
Total Protein: 6.7 g/dL (ref 6.1–8.1)
eGFR: 38 mL/min/{1.73_m2} — ABNORMAL LOW (ref >=60)

## 2022-07-07 LAB — CBC WITH DIFFERENTIAL/PLATELET
Absolute Monocytes: 612 cells/uL (ref 200–950)
Basophils Relative: 0.4 %
Eosinophils Absolute: 27 cells/uL (ref 15–500)
Eosinophils Relative: 0.4 %
Hemoglobin: 12.6 g/dL (ref 11.7–15.5)
Lymphs Abs: 2516 cells/uL (ref 850–3900)
MCHC: 30.9 g/dL — ABNORMAL LOW (ref 32.0–36.0)
MCV: 78.2 fL — ABNORMAL LOW (ref 80.0–100.0)
MPV: 11.4 fL (ref 7.5–12.5)
Neutro Abs: 3618 cells/uL (ref 1500–7800)
Neutrophils Relative %: 53.2 %
Platelets: 370 10*3/uL (ref 140–400)
Total Lymphocyte: 37 %
WBC: 6.8 10*3/uL (ref 3.8–10.8)

## 2022-07-07 LAB — MICROALBUMIN / CREATININE URINE RATIO
Creatinine, Urine: 170 mg/dL (ref 20–275)
Microalb Creat Ratio: 5 mg/g creat (ref <30)
Microalb, Ur: 0.8 mg/dL

## 2022-07-07 LAB — LIPID PANEL
HDL: 42 mg/dL — ABNORMAL LOW (ref >=50)
LDL Cholesterol (Calc): 85 mg/dL (calc)
Total CHOL/HDL Ratio: 3.6 (calc) (ref <5.0)
Triglycerides: 139 mg/dL (ref <150)

## 2022-07-08 ENCOUNTER — Encounter: Payer: Self-pay | Admitting: Internal Medicine

## 2022-07-08 ENCOUNTER — Ambulatory Visit (INDEPENDENT_AMBULATORY_CARE_PROVIDER_SITE_OTHER): Payer: Medicare Other | Admitting: Internal Medicine

## 2022-07-08 VITALS — BP 142/68 | HR 85 | Temp 99.2°F | Resp 16 | Ht 62.0 in | Wt 173.2 lb

## 2022-07-08 DIAGNOSIS — E785 Hyperlipidemia, unspecified: Secondary | ICD-10-CM | POA: Diagnosis not present

## 2022-07-08 DIAGNOSIS — E1169 Type 2 diabetes mellitus with other specified complication: Secondary | ICD-10-CM

## 2022-07-08 DIAGNOSIS — I10 Essential (primary) hypertension: Secondary | ICD-10-CM

## 2022-07-08 DIAGNOSIS — Z794 Long term (current) use of insulin: Secondary | ICD-10-CM

## 2022-07-08 DIAGNOSIS — N1832 Chronic kidney disease, stage 3b: Secondary | ICD-10-CM

## 2022-07-08 DIAGNOSIS — E119 Type 2 diabetes mellitus without complications: Secondary | ICD-10-CM

## 2022-07-21 NOTE — Patient Instructions (Signed)
It was a pleasure to see you today.  Please watch diet a bit and try to get some regular exercise.  Monitor blood pressure.  It is up a bit today at 140/68.  Return in 6 months for Medicare wellness visit and health maintenance exam as well as fasting labs.  Patient has declined to see endocrinologist at this time.

## 2022-07-22 ENCOUNTER — Other Ambulatory Visit: Payer: Self-pay | Admitting: Internal Medicine

## 2022-08-06 DIAGNOSIS — E119 Type 2 diabetes mellitus without complications: Secondary | ICD-10-CM | POA: Diagnosis not present

## 2022-08-06 DIAGNOSIS — H35033 Hypertensive retinopathy, bilateral: Secondary | ICD-10-CM | POA: Diagnosis not present

## 2022-08-06 DIAGNOSIS — H25813 Combined forms of age-related cataract, bilateral: Secondary | ICD-10-CM | POA: Diagnosis not present

## 2022-08-06 DIAGNOSIS — H40013 Open angle with borderline findings, low risk, bilateral: Secondary | ICD-10-CM | POA: Diagnosis not present

## 2022-08-06 DIAGNOSIS — H52222 Regular astigmatism, left eye: Secondary | ICD-10-CM | POA: Diagnosis not present

## 2022-08-06 DIAGNOSIS — H1789 Other corneal scars and opacities: Secondary | ICD-10-CM | POA: Diagnosis not present

## 2022-08-06 DIAGNOSIS — H18791 Other corneal deformities, right eye: Secondary | ICD-10-CM | POA: Diagnosis not present

## 2022-08-07 ENCOUNTER — Other Ambulatory Visit: Payer: Self-pay | Admitting: Internal Medicine

## 2022-08-09 ENCOUNTER — Other Ambulatory Visit: Payer: Self-pay | Admitting: Internal Medicine

## 2022-08-26 ENCOUNTER — Other Ambulatory Visit: Payer: Self-pay | Admitting: Internal Medicine

## 2022-10-21 ENCOUNTER — Other Ambulatory Visit: Payer: Self-pay | Admitting: Internal Medicine

## 2022-11-01 ENCOUNTER — Other Ambulatory Visit: Payer: Self-pay | Admitting: Internal Medicine

## 2022-11-03 ENCOUNTER — Other Ambulatory Visit: Payer: Self-pay | Admitting: Family

## 2022-11-10 ENCOUNTER — Other Ambulatory Visit: Payer: Self-pay

## 2022-11-10 MED ORDER — CARVEDILOL 6.25 MG PO TABS: 6.2500 mg | ORAL_TABLET | Freq: Two times a day (BID) | ORAL | 0 refills | Status: DC

## 2022-11-26 ENCOUNTER — Ambulatory Visit (INDEPENDENT_AMBULATORY_CARE_PROVIDER_SITE_OTHER): Payer: Medicare Other

## 2022-11-26 VITALS — BP 130/80 | HR 72 | Temp 98.0°F | Ht 62.0 in | Wt 173.0 lb

## 2022-11-26 DIAGNOSIS — Z23 Encounter for immunization: Secondary | ICD-10-CM | POA: Diagnosis not present

## 2022-11-26 NOTE — Progress Notes (Signed)
Patient is here for a flu shot, patient tolerated well.

## 2022-12-02 ENCOUNTER — Other Ambulatory Visit: Payer: Self-pay | Admitting: Internal Medicine

## 2023-01-06 NOTE — Progress Notes (Signed)
Annual Wellness Visit    Patient Care Team: Jazari Ober, Luanna Cole, MD as PCP - General (Internal Medicine) Mat Carne, DO (Optometry)  Visit Date: 01/13/23   Chief Complaint  Patient presents with   Medicare Wellness    Subjective:   Patient: Erin [name], Female    DOB: 02-May-1945, 77 y.o.   MRN: [id number]  Erin [name] is a 77 y.o. Female who presents today for her Annual Wellness Visit. History of chronic kidney disease stage IV, Type 2 diabetes mellitus, hyperlipidemia, hypertension.  History of Type 2 diabetes mellitus treated with Novolog 7 units three times daily, Lantus 50 units daily. HGBA1c at 7.1% on 01/11/23, down from 8% on 07/06/22.  History of hypertension treated with carvedilol 6.25 mg twice daily, furosemide 40 mg daily, Caduet 10-40 mg daily. Blood pressure elevated in-office today at 160/80.  History of Vitamin D deficiency treated with Drisdol 50,000 units weekly.  History of anxiety treated with alprazolam 1 mg twice daily as needed.   Has annual diabetic eye exam at [facility].  History of corneal edema and elevated intraocular pressures.  Has thick corneas.  Findings apparently are borderline and felt to be low risk bilaterally. Reports she is UTD on annual eye exam.   She was admitted to the hospital in Feb 26, 2008 with renal failure.  At that time she was in diabetic ketoacidosis with hyperosmolar state.  Serum glucose was 1212.  Serum creatinine was 4 and she was volume depleted.  Creatinine never returned to normal after that.  She has chronic kidney disease which is stable. Creatinine elevated at 1.6, GFR low at 33.   Longstanding history of hypertension which was present prior to that admission.  01/11/23 labs reviewed today. Glucose elevated today at 129. RBC elevated at 5.28. MCV low at 79.7. MCH low at 24.2. MCHC low at 30.4. HDL low at 41. TSH at 2.1.  Denies feet swelling.  No recent mammogram.  Declines colonoscopy.  Social history:  She has 3 daughters.  She and her husband were married for some 25 years and he died of a massive MI in 02-26-2004.  She was born in [place] and grew up in [place].  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   Past Medical History:  Diagnosis Date   Chronic kidney disease    Diabetes mellitus    IDDM   Hyperlipidemia    Hypertension      Family History  Problem Relation Age of Onset   Cancer Mother    Diabetes Mother      Social History   Social History Narrative   Not on file     Review of Systems  Constitutional:  Negative for chills, fever, malaise/fatigue and weight loss.  HENT:  Negative for hearing loss, sinus pain and sore throat.   Respiratory:  Negative for cough, hemoptysis and shortness of breath.   Cardiovascular:  Negative for chest pain, palpitations, leg swelling and PND.  Gastrointestinal:  Negative for abdominal pain, constipation, diarrhea, heartburn, nausea and vomiting.  Genitourinary:  Negative for dysuria, frequency and urgency.  Musculoskeletal:  Negative for back pain, myalgias and neck pain.  Skin:  Negative for itching and rash.  Neurological:  Negative for dizziness, tingling, seizures and headaches.  Endo/Heme/Allergies:  Negative for polydipsia.  Psychiatric/Behavioral:  Negative for depression. The patient is not nervous/anxious.       Objective:  Vitals: BP (!) 160/80   Pulse 86   Ht 5\' 2"  (1.575 m)   Wt 171 lb (77.6 kg)   SpO2 98%   BMI 31.28 kg/m   Physical Exam Vitals and nursing note reviewed.  Constitutional:      General: She is not in acute distress.    Appearance: Normal appearance. She is not ill-appearing or toxic-appearing.  HENT:     Head: Normocephalic and atraumatic.     Right Ear: Hearing, tympanic membrane, ear canal and external ear normal.     Left Ear: Hearing, tympanic membrane, ear canal and external ear normal.      Mouth/Throat:     Pharynx: Oropharynx is clear.  Eyes:     Extraocular Movements: Extraocular movements intact.     Pupils: Pupils are equal, round, and reactive to light.  Neck:     Thyroid: No thyroid mass, thyromegaly or thyroid tenderness.     Vascular: No carotid bruit.  Cardiovascular:     Rate and Rhythm: Normal rate and regular rhythm. No extrasystoles are present.    Pulses:          Dorsalis pedis pulses are 2+ on the right side and 2+ on the left side.       Posterior tibial pulses are 1+ on the right side and 1+ on the left side.     Heart sounds: Normal heart sounds. No murmur heard.    No friction rub. No gallop.  Pulmonary:     Effort: Pulmonary effort is normal.     Breath sounds: Normal breath sounds. No decreased breath sounds, wheezing, rhonchi or rales.  Chest:     Chest wall: No mass.  Abdominal:     Palpations: Abdomen is soft. There is no hepatomegaly, splenomegaly or mass.     Tenderness: There is no abdominal tenderness.     Hernia: No hernia is present.  Musculoskeletal:     Cervical back: Normal range of motion.     Right lower leg: No edema.     Left lower leg: No edema.  Feet:     Comments: Sensation intact bilaterally. No lesions. Lymphadenopathy:     Cervical: No cervical adenopathy.     Upper Body:     Right upper body: No supraclavicular adenopathy.     Left upper body: No supraclavicular adenopathy.  Skin:    General: Skin is warm and dry.  Neurological:     General: No focal deficit present.     Mental Status: She is alert and oriented to person, place, and time. Mental status is at baseline.     Sensory: Sensation is intact.     Motor: Motor function is intact. No weakness.     Deep Tendon Reflexes: Reflexes are normal and symmetric.  Psychiatric:        Attention and Perception: Attention normal.        Mood and Affect: Mood normal.        Speech: Speech normal.        Behavior: Behavior normal.        Thought Content: Thought  content normal.        Cognition and Memory: Cognition normal.        Judgment: Judgment normal.      Most recent functional status assessment:    01/13/2023   10:09 AM  In your present state of health, do you have any difficulty performing the following activities:  Hearing? 0  Vision? 0  Difficulty concentrating or making decisions? 0  Walking or climbing stairs? 0  Dressing or bathing? 0  Doing errands, shopping? 0  Preparing Food and eating ? N  Using the Toilet? N  In the past six months, have you accidently leaked urine? N  Do you have problems with loss of bowel control? N  Managing your Medications? N  Managing your Finances? N  Housekeeping or managing your Housekeeping? N   Most recent fall risk assessment:    01/13/2023   10:10 AM  Fall Risk   Falls in the past year? 0  Number falls in past yr: 0  Injury with Fall? 0  Risk for fall due to : No Fall Risks  Follow up Falls prevention discussed;Education provided;Falls evaluation completed    Most recent depression screenings:    01/13/2023   10:19 AM 01/06/2022   11:15 AM  PHQ 2/9 Scores  PHQ - 2 Score 0 0   Most recent cognitive screening:    01/13/2023   10:05 AM  6CIT Screen  What Year? 0 points  What month? 0 points  What time? 0 points  Count back from 20 0 points  Months in reverse 0 points  Repeat phrase 0 points  Total Score 0 points     Results:   Studies obtained and personally reviewed by me:   Labs:       Component Value Date/Time   NA 140 01/11/2023 0910   K 5.1 01/11/2023 0910   CL 103 01/11/2023 0910   CO2 27 01/11/2023 0910   GLUCOSE 129 (H) 01/11/2023 0910   BUN 22 01/11/2023 0910   CREATININE 1.60 (H) 01/11/2023 0910   CALCIUM 10.3 01/11/2023 0910   CALCIUM 7.8 (L) 04/01/2008 0455   PROT 6.9 01/11/2023 0910   ALBUMIN 4.3 04/20/2016 1145   AST 19 01/11/2023 0910   ALT 25 01/11/2023 0910   ALKPHOS 84 04/20/2016 1145   BILITOT 0.8 01/11/2023 0910   GFRNONAA 29  (L) 11/27/2019 0917   GFRAA 34 (L) 11/27/2019 0917     Lab Results  Component Value Date   WBC 6.2 01/11/2023   HGB 12.8 01/11/2023   HCT 42.1 01/11/2023   MCV 79.7 (L) 01/11/2023   PLT 360 01/11/2023    Lab Results  Component Value Date   CHOL 140 01/11/2023   HDL 41 (L) 01/11/2023   LDLCALC 79 01/11/2023   TRIG 112 01/11/2023   CHOLHDL 3.4 01/11/2023    Lab Results  Component Value Date   HGBA1C 7.1 (H) 01/11/2023     Lab Results  Component Value Date   TSH 2.10 01/11/2023    Assessment & Plan:   Type 2 diabetes mellitus: treated with Novolog 7 units three times daily, Lantus 50 units daily. HGBA1c at 7.1% on 01/11/23, down from 8% on 07/06/22.  Hypertension: treated with carvedilol 6.25 mg twice daily, furosemide 40 mg daily, Caduet 10-40 mg daily. Blood pressure elevated in-office today at 160/80.  Chronic kidney disease stage IV: Creatinine elevated at 1.6, GFR low at 33.  Vitamin D deficiency: treated with Drisdol 50,000 units weekly.  Anxiety: treated with alprazolam 1 mg twice daily as needed.   Declines mammogram, colonoscopy.  Vaccine counseling: UTD on tetanus, shingles, pneumococcal 20, flu vaccines.  Return in 6 months for checkup or as needed.     Annual wellness visit done today including the all of the following: Reviewed patient's Family Medical History Reviewed and updated list of patient's medical providers Assessment  of cognitive impairment was done Assessed patient's functional ability Established a written schedule for health screening services Health Risk Assessent Completed and Reviewed  Discussed health benefits of physical activity, and encouraged her to engage in regular exercise appropriate for her age and condition.        I,Alexander Ruley,acting as a Neurosurgeon for Margaree Mackintosh, MD.,have documented all relevant documentation on the behalf of Margaree Mackintosh, MD,as directed by  Margaree Mackintosh, MD while in the presence of Margaree Mackintosh, MD.   I, Margaree Mackintosh, MD, have reviewed all documentation for this visit. The documentation on 01/29/23 for the exam, diagnosis, procedures, and orders are all accurate and complete.

## 2023-01-11 ENCOUNTER — Other Ambulatory Visit: Payer: Medicare Other

## 2023-01-11 DIAGNOSIS — N1832 Chronic kidney disease, stage 3b: Secondary | ICD-10-CM | POA: Diagnosis not present

## 2023-01-11 DIAGNOSIS — E119 Type 2 diabetes mellitus without complications: Secondary | ICD-10-CM | POA: Diagnosis not present

## 2023-01-11 DIAGNOSIS — Z794 Long term (current) use of insulin: Secondary | ICD-10-CM

## 2023-01-11 DIAGNOSIS — E1169 Type 2 diabetes mellitus with other specified complication: Secondary | ICD-10-CM | POA: Diagnosis not present

## 2023-01-11 DIAGNOSIS — I10 Essential (primary) hypertension: Secondary | ICD-10-CM

## 2023-01-11 DIAGNOSIS — Z0000 Encounter for general adult medical examination without abnormal findings: Secondary | ICD-10-CM | POA: Diagnosis not present

## 2023-01-11 DIAGNOSIS — E785 Hyperlipidemia, unspecified: Secondary | ICD-10-CM | POA: Diagnosis not present

## 2023-01-12 LAB — CBC WITH DIFFERENTIAL/PLATELET
Absolute Lymphocytes: 2145 {cells}/uL (ref 850–3900)
Absolute Monocytes: 533 {cells}/uL (ref 200–950)
Basophils Absolute: 31 {cells}/uL (ref 0–200)
Basophils Relative: 0.5 %
Eosinophils Absolute: 43 {cells}/uL (ref 15–500)
Eosinophils Relative: 0.7 %
HCT: 42.1 % (ref 35.0–45.0)
Hemoglobin: 12.8 g/dL (ref 11.7–15.5)
MCH: 24.2 pg — ABNORMAL LOW (ref 27.0–33.0)
MCHC: 30.4 g/dL — ABNORMAL LOW (ref 32.0–36.0)
MCV: 79.7 fL — ABNORMAL LOW (ref 80.0–100.0)
MPV: 11.2 fL (ref 7.5–12.5)
Monocytes Relative: 8.6 %
Neutro Abs: 3447 {cells}/uL (ref 1500–7800)
Neutrophils Relative %: 55.6 %
Platelets: 360 10*3/uL (ref 140–400)
RBC: 5.28 10*6/uL — ABNORMAL HIGH (ref 3.80–5.10)
RDW: 14.9 % (ref 11.0–15.0)
Total Lymphocyte: 34.6 %
WBC: 6.2 10*3/uL (ref 3.8–10.8)

## 2023-01-12 LAB — HEMOGLOBIN A1C
Hgb A1c MFr Bld: 7.1 %{Hb} — ABNORMAL HIGH (ref <5.7)
Mean Plasma Glucose: 157 mg/dL
eAG (mmol/L): 8.7 mmol/L

## 2023-01-12 LAB — COMPLETE METABOLIC PANEL WITH GFR
AG Ratio: 1.7 (calc) (ref 1.0–2.5)
ALT: 25 U/L (ref 6–29)
AST: 19 U/L (ref 10–35)
Albumin: 4.3 g/dL (ref 3.6–5.1)
Alkaline phosphatase (APISO): 109 U/L (ref 37–153)
BUN/Creatinine Ratio: 14 (calc) (ref 6–22)
BUN: 22 mg/dL (ref 7–25)
CO2: 27 mmol/L (ref 20–32)
Calcium: 10.3 mg/dL (ref 8.6–10.4)
Chloride: 103 mmol/L (ref 98–110)
Creat: 1.6 mg/dL — ABNORMAL HIGH (ref 0.60–1.00)
Globulin: 2.6 g/dL (ref 1.9–3.7)
Glucose, Bld: 129 mg/dL — ABNORMAL HIGH (ref 65–99)
Potassium: 5.1 mmol/L (ref 3.5–5.3)
Sodium: 140 mmol/L (ref 135–146)
Total Bilirubin: 0.8 mg/dL (ref 0.2–1.2)
Total Protein: 6.9 g/dL (ref 6.1–8.1)
eGFR: 33 mL/min/{1.73_m2} — ABNORMAL LOW (ref >=60)

## 2023-01-12 LAB — LIPID PANEL
Cholesterol: 140 mg/dL (ref <200)
HDL: 41 mg/dL — ABNORMAL LOW (ref >=50)
LDL Cholesterol (Calc): 79 mg/dL
Non-HDL Cholesterol (Calc): 99 mg/dL (ref <130)
Total CHOL/HDL Ratio: 3.4 (calc) (ref <5.0)
Triglycerides: 112 mg/dL (ref <150)

## 2023-01-12 LAB — TSH: TSH: 2.1 m[IU]/L (ref 0.40–4.50)

## 2023-01-13 ENCOUNTER — Ambulatory Visit: Payer: Medicare Other | Admitting: Internal Medicine

## 2023-01-13 VITALS — BP 138/70 | HR 86 | Ht 62.0 in | Wt 171.0 lb

## 2023-01-13 DIAGNOSIS — E1169 Type 2 diabetes mellitus with other specified complication: Secondary | ICD-10-CM

## 2023-01-13 DIAGNOSIS — E785 Hyperlipidemia, unspecified: Secondary | ICD-10-CM

## 2023-01-13 DIAGNOSIS — Z794 Long term (current) use of insulin: Secondary | ICD-10-CM | POA: Diagnosis not present

## 2023-01-13 DIAGNOSIS — N1832 Chronic kidney disease, stage 3b: Secondary | ICD-10-CM

## 2023-01-13 DIAGNOSIS — E119 Type 2 diabetes mellitus without complications: Secondary | ICD-10-CM

## 2023-01-13 DIAGNOSIS — I10 Essential (primary) hypertension: Secondary | ICD-10-CM | POA: Diagnosis not present

## 2023-01-13 DIAGNOSIS — Z0000 Encounter for general adult medical examination without abnormal findings: Secondary | ICD-10-CM

## 2023-01-13 LAB — POCT URINALYSIS DIP (CLINITEK)
Bilirubin, UA: NEGATIVE
Blood, UA: NEGATIVE
Glucose, UA: NEGATIVE mg/dL
Ketones, POC UA: NEGATIVE mg/dL
Leukocytes, UA: NEGATIVE
Nitrite, UA: NEGATIVE
POC PROTEIN,UA: NEGATIVE
Spec Grav, UA: 1.01 (ref 1.010–1.025)
Urobilinogen, UA: 0.2 U/dL
pH, UA: 7 (ref 5.0–8.0)

## 2023-01-13 LAB — HM DIABETES EYE EXAM

## 2023-01-13 NOTE — Patient Instructions (Addendum)
Next appointment: Follow up in one year for your annual wellness visit    Preventive Care 77 Years and Older, Female Preventive care refers to lifestyle choices and visits with your health care provider that can promote health and wellness. What does preventive care include? A yearly physical exam. This is also called an annual well check. Dental exams once or twice a year. Routine eye exams. Ask your health care provider how often you should have your eyes checked. Personal lifestyle choices, including: Daily care of your teeth and gums. Regular physical activity. Eating a healthy diet. Avoiding tobacco and drug use. Limiting alcohol use. Practicing safe sex. Taking low-dose aspirin every day. Taking vitamin and mineral supplements as recommended by your health care provider. What happens during an annual well check? The services and screenings done by your health care provider during your annual well check will depend on your age, overall health, lifestyle risk factors, and family history of disease. Counseling  Your health care provider may ask you questions about your: Alcohol use. Tobacco use. Drug use. Emotional well-being. Home and relationship well-being. Sexual activity. Eating habits. History of falls. Memory and ability to understand (cognition). Work and work Astronomer. Reproductive health. Screening  You may have the following tests or measurements: Height, weight, and BMI. Blood pressure. Lipid and cholesterol levels. These may be checked every 5 years, or more frequently if you are over 3 years old. Skin check. Lung cancer screening. You may have this screening every year starting at age 77 if you have a 30-pack-year history of smoking and currently smoke or have quit within the past 15 years. Fecal occult blood test (FOBT) of the stool. You may have this test every year starting at age 77. Flexible sigmoidoscopy or colonoscopy. You may have a  sigmoidoscopy every 5 years or a colonoscopy every 10 years starting at age 77. Hepatitis C blood test. Hepatitis B blood test. Sexually transmitted disease (STD) testing. Diabetes screening. This is done by checking your blood sugar (glucose) after you have not eaten for a while (fasting). You may have this done every 1-3 years. Bone density scan. This is done to screen for osteoporosis. You may have this done starting at age 77. Mammogram. This may be done every 1-2 years. Talk to your health care provider about how often you should have regular mammograms. Talk with your health care provider about your test results, treatment options, and if necessary, the need for more tests. Vaccines  Your health care provider may recommend certain vaccines, such as: Influenza vaccine. This is recommended every year. Tetanus, diphtheria, and acellular pertussis (Tdap, Td) vaccine. You may need a Td booster every 10 years. Zoster vaccine. You may need this after age 77. Pneumococcal 13-valent conjugate (PCV13) vaccine. One dose is recommended after age 77. Pneumococcal polysaccharide (PPSV23) vaccine. One dose is recommended after age 66. Talk to your health care provider about which screenings and vaccines you need and how often you need them. This information is not intended to replace advice given to you by your health care provider. Make sure you discuss any questions you have with your health care provider. Document Released: 02/14/2015 Document Revised: 10/08/2015 Document Reviewed: 11/19/2014 Elsevier Interactive Patient Education  2017 [employer].  Fall Prevention in the Home Falls can cause injuries. They can happen to people of all ages. There are many things you can do to make your home safe and to help prevent falls. What can I do on the outside of  my home? Regularly fix the edges of walkways and driveways and fix any cracks. Remove anything that might make you trip as you walk through a  door, such as a raised step or threshold. Trim any bushes or trees on the path to your home. Use bright outdoor lighting. Clear any walking paths of anything that might make someone trip, such as rocks or tools. Regularly check to see if handrails are loose or broken. Make sure that both sides of any steps have handrails. Any raised decks and porches should have guardrails on the edges. Have any leaves, snow, or ice cleared regularly. Use sand or salt on walking paths during winter. Clean up any spills in your garage right away. This includes oil or grease spills. What can I do in the bathroom? Use night lights. Install grab bars by the toilet and in the tub and shower. Do not use towel bars as grab bars. Use non-skid mats or decals in the tub or shower. If you need to sit down in the shower, use a plastic, non-slip stool. Keep the floor dry. Clean up any water that spills on the floor as soon as it happens. Remove soap buildup in the tub or shower regularly. Attach bath mats securely with double-sided non-slip rug tape. Do not have throw rugs and other things on the floor that can make you trip. What can I do in the bedroom? Use night lights. Make sure that you have a light by your bed that is easy to reach. Do not use any sheets or blankets that are too big for your bed. They should not hang down onto the floor. Have a firm chair that has side arms. You can use this for support while you get dressed. Do not have throw rugs and other things on the floor that can make you trip. What can I do in the kitchen? Clean up any spills right away. Avoid walking on wet floors. Keep items that you use a lot in easy-to-reach places. If you need to reach something above you, use a strong step stool that has a grab bar. Keep electrical cords out of the way. Do not use floor polish or wax that makes floors slippery. If you must use wax, use non-skid floor wax. Do not have throw rugs and other things  on the floor that can make you trip. What can I do with my stairs? Do not leave any items on the stairs. Make sure that there are handrails on both sides of the stairs and use them. Fix handrails that are broken or loose. Make sure that handrails are as long as the stairways. Check any carpeting to make sure that it is firmly attached to the stairs. Fix any carpet that is loose or worn. Avoid having throw rugs at the top or bottom of the stairs. If you do have throw rugs, attach them to the floor with carpet tape. Make sure that you have a light switch at the top of the stairs and the bottom of the stairs. If you do not have them, ask someone to add them for you. What else can I do to help prevent falls? Wear shoes that: Do not have high heels. Have rubber bottoms. Are comfortable and fit you well. Are closed at the toe. Do not wear sandals. If you use a stepladder: Make sure that it is fully opened. Do not climb a closed stepladder. Make sure that both sides of the stepladder are locked into place. Ask  someone to hold it for you, if possible. Clearly mark and make sure that you can see: Any grab bars or handrails. First and last steps. Where the edge of each step is. Use tools that help you move around (mobility aids) if they are needed. These include: Canes. Walkers. Scooters. Crutches. Turn on the lights when you go into a dark area. Replace any light bulbs as soon as they burn out. Set up your furniture so you have a clear path. Avoid moving your furniture around. If any of your floors are uneven, fix them. If there are any pets around you, be aware of where they are. Review your medicines with your doctor. Some medicines can make you feel dizzy. This can increase your chance of falling. Ask your doctor what other things that you can do to help prevent falls. This information is not intended to replace advice given to you by your health care provider. Make sure you discuss any  questions you have with your health care provider. Document Released: 11/14/2008 Document Revised: 06/26/2015 Document Reviewed: 02/22/2014 Elsevier Interactive Patient Education  2017 [employer].  It was a pleasure to see you today.  Continue current medications and follow-up in 6 months.

## 2023-01-13 NOTE — Progress Notes (Signed)
Subjective:   Erin [name] is a 77 y.o. female who presents for Medicare Annual (Subsequent) preventive examination.  Visit Complete: In person  Patient Medicare AWV questionnaire was completed by the patient on 01/13/23; I have confirmed that all information answered by patient is correct and no changes since this date.  Cardiac Risk Factors include: advanced age (>43men, >56 women);diabetes mellitus;dyslipidemia     Objective:    Today's Vitals   01/13/23 1002 01/13/23 1008 01/13/23 1035 01/13/23 1036  BP: (!) 160/80  (!) 150/70 138/70  Pulse: 86     SpO2: 98%     Weight: 171 lb (77.6 kg)     Height: 5\' 2"  (1.575 m)     PainSc: 0-No pain 0-No pain     Body mass index is 31.28 kg/m.     12/29/2021   11:08 AM  Advanced Directives  Does Patient Have a Medical Advance Directive? No  Would patient like information on creating a medical advance directive? No - Patient declined    Current Medications (verified) Outpatient Encounter Medications as of 01/13/2023  Medication Sig   ALPRAZolam (XANAX) 1 MG tablet Take 1 tablet by mouth twice daily as needed for anxiety   CADUET 10-40 MG tablet TAKE 1 TABLET BY MOUTH DAILY   carvedilol (COREG) 6.25 MG tablet Take 1 tablet (6.25 mg total) by mouth 2 (two) times daily with a meal.   furosemide (LASIX) 40 MG tablet Take 1 tablet by mouth once daily   insulin aspart (NOVOLOG) 100 UNIT/ML injection INJECT 7 UNITS INTO THE SKIN 3 TIMES A DAY BEFORE MEALS   insulin glargine (LANTUS) 100 UNIT/ML injection INJECT 50 UNITS INTO THE SKIN DAILY AT 10PM.   Polyethyl Glycol-Propyl Glycol (SYSTANE OP) Apply to eye.   Vitamin D, Ergocalciferol, (DRISDOL) 1.25 MG (50000 UNIT) CAPS capsule Take 1 capsule by mouth once a week   No facility-administered encounter medications on file as of 01/13/2023.    Allergies (verified) Patient has no known allergies.   History: Past Medical History:  Diagnosis Date   Chronic kidney disease     Diabetes mellitus    IDDM   Hyperlipidemia    Hypertension    No past surgical history on file. Family History  Problem Relation Age of Onset   Cancer Mother    Diabetes Mother    Social History   Socioeconomic History   Marital status: Widowed    Spouse name: Not on file   Number of children: Not on file   Years of education: Not on file   Highest education level: Not on file  Occupational History   Not on file  Tobacco Use   Smoking status: Never   Smokeless tobacco: Never  Substance and Sexual Activity   Alcohol use: No   Drug use: No   Sexual activity: Not Currently  Other Topics Concern   Not on file  Social History Narrative   Not on file   Social Drivers of Health   Financial Resource Strain: Low Risk  (01/13/2023)   Overall Financial Resource Strain (CARDIA)    Difficulty of Paying Living Expenses: Not hard at all  Food Insecurity: No Food Insecurity (01/13/2023)   Hunger Vital Sign    Worried About Running Out of Food in the Last Year: Never true    Ran Out of Food in the Last Year: Never true  Transportation Needs: No Transportation Needs (01/13/2023)   PRAPARE - Administrator, Civil Service (Medical):  No    Lack of Transportation (Non-Medical): No  Physical Activity: Insufficiently Active (01/13/2023)   Exercise Vital Sign    Days of Exercise per Week: 2 days    Minutes of Exercise per Session: 30 min  Stress: No Stress Concern Present (01/13/2023)   [employer] of Occupational Health - Occupational Stress Questionnaire    Feeling of Stress : Only a little  Social Connections: Moderately Integrated (01/13/2023)   Social Connection and Isolation Panel [NHANES]    Frequency of Communication with Friends and Family: More than three times a week    Frequency of Social Gatherings with Friends and Family: More than three times a week    Attends Religious Services: More than 4 times per year    Active Member of [employer] or Organizations: No     Attends Engineer, structural: More than 4 times per year    Marital Status: Widowed    Tobacco Counseling Counseling given: Not Answered   Clinical Intake:  Pre-visit preparation completed: Yes  Pain : No/denies pain Pain Score: 0-No pain     BMI - recorded: 31.28 Nutritional Risks: None Diabetes: Yes        Information entered by :: Dixie Dials, CMA   Activities of Daily Living    01/13/2023   10:09 AM  In your present state of health, do you have any difficulty performing the following activities:  Hearing? 0  Vision? 0  Difficulty concentrating or making decisions? 0  Walking or climbing stairs? 0  Dressing or bathing? 0  Doing errands, shopping? 0  Preparing Food and eating ? N  Using the Toilet? N  In the past six months, have you accidently leaked urine? N  Do you have problems with loss of bowel control? N  Managing your Medications? N  Managing your Finances? N  Housekeeping or managing your Housekeeping? N    Patient Care Team: Margaree Mackintosh, MD as PCP - General (Internal Medicine) Mat Carne, DO (Optometry)  Indicate any recent Medical Services you may have received from other than [facility] providers in the past year (date may be approximate).     Assessment:   This is a routine wellness examination for Erin [name].  Hearing/Vision screen No results found.   Goals Addressed   None   Depression Screen    01/13/2023   10:19 AM 01/06/2022   11:15 AM 12/29/2021   11:08 AM 12/02/2020   11:11 AM 12/02/2020   11:08 AM 11/30/2019   10:06 AM 05/25/2019    9:46 AM  PHQ 2/9 Scores  PHQ - 2 Score 0 0 0 2 0 0 0  PHQ- 9 Score    2       Fall Risk    01/13/2023   10:10 AM 07/08/2022   10:34 AM 12/29/2021   11:08 AM 12/02/2020   11:08 AM 11/30/2019   10:06 AM  Fall Risk   Falls in the past year? 0 0 0 0 0  Number falls in past yr: 0 0 0 0 0  Injury with Fall? 0 0 0 0 0  Risk for fall due to : No Fall Risks No Fall Risks No Fall  Risks No Fall Risks   Follow up Falls prevention discussed;Education provided;Falls evaluation completed Falls evaluation completed Falls evaluation completed Falls evaluation completed Falls evaluation completed    MEDICARE RISK AT HOME: Medicare Risk at Home Any stairs in or around the home?: No If so, are there any without  handrails?: No Home free of loose throw rugs in walkways, pet beds, electrical cords, etc?: Yes Adequate lighting in your home to reduce risk of falls?: Yes Life alert?: No Use of a cane, walker or w/c?: No Grab bars in the bathroom?: No Shower chair or bench in shower?: No Elevated toilet seat or a handicapped toilet?: No  TIMED UP AND GO:  Was the test performed?  No    Cognitive Function:        01/13/2023   10:05 AM 12/29/2021   11:10 AM  6CIT Screen  What Year? 0 points 0 points  What month? 0 points 0 points  What time? 0 points   Count back from 20 0 points 0 points  Months in reverse 0 points 2 points  Repeat phrase 0 points 0 points  Total Score 0 points     Immunizations Immunization History  Administered Date(s) Administered   Influenza Split 10/15/2011   Influenza Whole 10/02/2010   Influenza, Seasonal, Injecte, Preservative Fre 11/26/2022   Influenza,inj,Quad PF,6+ Mos 10/31/2014, 10/13/2015, 10/22/2016, 10/31/2017, 11/03/2018, 11/30/2019, 12/02/2020, 10/07/2021   PFIZER(Purple Top)SARS-COV-2 Vaccination 06/02/2019, 06/25/2019, 02/09/2020   PNEUMOCOCCAL CONJUGATE-20 12/29/2021   Pneumococcal Conjugate-13 04/25/2015   Pneumococcal Polysaccharide-23 04/04/2010   Tdap 04/03/2010, 07/12/2022   Zoster Recombinant(Shingrix) 04/28/2021, 07/21/2021    TDAP status: Due, Education has been provided regarding the importance of this vaccine. Advised may receive this vaccine at local pharmacy or Health Dept. Aware to provide a copy of the vaccination record if obtained from local pharmacy or Health Dept. Verbalized acceptance and  understanding.  Flu Vaccine status: Up to date  Pneumococcal vaccine status: Up to date  Covid-19 vaccine status: Declined, Education has been provided regarding the importance of this vaccine but patient still declined. Advised may receive this vaccine at local pharmacy or Health Dept.or vaccine clinic. Aware to provide a copy of the vaccination record if obtained from local pharmacy or Health Dept. Verbalized acceptance and understanding.  Qualifies for Shingles Vaccine? Yes   Zostavax completed Yes   Shingrix Completed?: Yes  Screening Tests Health Maintenance  Topic Date Due   OPHTHALMOLOGY EXAM  07/08/2022   DEXA SCAN  07/08/2023 (Originally 02/25/2010)   Diabetic kidney evaluation - Urine ACR  07/06/2023   FOOT EXAM  07/08/2023   HEMOGLOBIN A1C  07/12/2023   Diabetic kidney evaluation - eGFR measurement  01/11/2024   Medicare Annual Wellness (AWV)  01/13/2024   DTaP/Tdap/Td (3 - Td or Tdap) 07/11/2032   Pneumonia Vaccine 3+ Years old  Completed   INFLUENZA VACCINE  Completed   Zoster Vaccines- Shingrix  Completed   HPV VACCINES  Aged Out   COVID-19 Vaccine  Discontinued   Hepatitis C Screening  Discontinued    Health Maintenance  Health Maintenance Due  Topic Date Due   OPHTHALMOLOGY EXAM  07/08/2022    Lung Cancer Screening: (Low Dose CT Chest recommended if Age 22-80 years, 20 pack-year currently smoking OR have quit w/in 15years.) does not qualify.    Additional Screening:  Hepatitis C Screening: does not qualify; Completed  Vision Screening: Recommended annual ophthalmology exams for early detection of glaucoma and other disorders of the eye. Is the patient up to date with their annual eye exam?  Yes  Who is the provider or what is the name of the office in which the patient attends annual eye exams? [email or web address] If pt is not established with a provider, would they like to be referred to a provider to establish care? No .  Dental Screening: Recommended annual  dental exams for proper oral hygiene  Diabetic Foot Exam: Diabetic Foot Exam: Completed 01/13/23  Community Resource Referral / Chronic Care Management: CRR required this visit?  No   CCM required this visit?  No     Plan:     I have personally reviewed and noted the following in the patient's chart:   Medical and social history Use of alcohol, tobacco or illicit drugs  Current medications and supplements including opioid prescriptions. Patient is not currently taking opioid prescriptions. Functional ability and status Nutritional status Physical activity Advanced directives List of other physicians Hospitalizations, surgeries, and ER visits in previous 12 months Vitals Screenings to include cognitive, depression, and falls Referrals and appointments  In addition, I have reviewed and discussed with patient certain preventive protocols, quality metrics, and best practice recommendations. A written personalized care plan for preventive services as well as general preventive health recommendations were provided to patient.     Abdurrahman Petersheim Sharlyne Cai, CMA   01/13/2023   After Visit Summary: (In Person-Printed) AVS printed and given to the patient

## 2023-01-29 ENCOUNTER — Encounter: Payer: Self-pay | Admitting: Internal Medicine

## 2023-01-31 ENCOUNTER — Other Ambulatory Visit: Payer: Self-pay | Admitting: Internal Medicine

## 2023-02-07 ENCOUNTER — Other Ambulatory Visit: Payer: Self-pay | Admitting: Internal Medicine

## 2023-04-28 ENCOUNTER — Other Ambulatory Visit: Payer: Self-pay | Admitting: Internal Medicine

## 2023-05-04 ENCOUNTER — Other Ambulatory Visit: Payer: Self-pay | Admitting: Internal Medicine

## 2023-06-01 ENCOUNTER — Other Ambulatory Visit: Payer: Self-pay | Admitting: Internal Medicine

## 2023-06-10 ENCOUNTER — Other Ambulatory Visit: Payer: Self-pay

## 2023-06-10 ENCOUNTER — Telehealth: Payer: Self-pay | Admitting: Internal Medicine

## 2023-06-10 MED ORDER — INSULIN GLARGINE 100 UNIT/ML ~~LOC~~ SOLN: SUBCUTANEOUS | 99 refills | Status: AC

## 2023-06-10 NOTE — Telephone Encounter (Signed)
 Refill Lantus  insulin  100U/ml vial with instructions to inject 50 units SQ daily at 10 pm.

## 2023-06-15 ENCOUNTER — Other Ambulatory Visit: Payer: Self-pay | Admitting: Internal Medicine

## 2023-07-12 ENCOUNTER — Other Ambulatory Visit: Payer: Medicare Other

## 2023-07-12 DIAGNOSIS — E119 Type 2 diabetes mellitus without complications: Secondary | ICD-10-CM

## 2023-07-12 DIAGNOSIS — Z794 Long term (current) use of insulin: Secondary | ICD-10-CM | POA: Diagnosis not present

## 2023-07-12 DIAGNOSIS — E785 Hyperlipidemia, unspecified: Secondary | ICD-10-CM | POA: Diagnosis not present

## 2023-07-12 DIAGNOSIS — N1832 Chronic kidney disease, stage 3b: Secondary | ICD-10-CM | POA: Diagnosis not present

## 2023-07-12 DIAGNOSIS — E1169 Type 2 diabetes mellitus with other specified complication: Secondary | ICD-10-CM | POA: Diagnosis not present

## 2023-07-12 NOTE — Progress Notes (Addendum)
 Patient Care Team: Erin Evener, MD as PCP - General (Internal Medicine) Altamease Asters, DO (Optometry)  Visit Date: 07/14/23  Subjective:   Chief Complaint  Patient presents with   Hyperlipidemia   Hypertension   Diabetes   Vitals:   07/14/23 1028 07/14/23 1044 07/14/23 1047  BP: (!) 160/100 (!) 140/70 138/70   Patient NW:GNFAO Erin [name],Female DOB:Mar 25, 1945,78 y.o. ZHY:[id number]   78 y.o.Female, seen for her annual visit on 01/13/2023, presents today for 6 months follow-up for Hypertension; Hyperlipidemia; Diabetes Mellitus, type II; Chronic Kidney Disease, stage IV. Patient has a past medical history of Anxiety; Vitamin-D Deficiency.    History of Hypertension treated with Caduet  10-40 mg daily, Carvedilol  6.25 mg twice daily. Dependent Edema treated with Lasix  40 mg daily. Blood Pressure: elevated today at 160/100,  18 minutes later decreased to 140/70, and 3 minutes following that normalized to 138/70.   History of Hyperlipidemia with 07/12/2023 Lipid Panel: HDL 44, elevated from 41 in 01/2023.   History of Diabetes Mellitus, type II treated with Novolog  7 units injected three times daily before meals and Lantus  50 units injected nightly. 07/12/2023 HgbA1c 7.7, elevated from 7.1; Glucose 159, elevated from 129 in 01/2023 - says that when she checks her glucose it ranges between 77 - 120. UTD on eye exam through [facility] on 01/13/2023. Says that she has occasional episodes of hypoglycemia, but doesn't notice any side effects.  History of Chronic Kidney Disease- with Creatinine 1.35, improved from 01/2023.   History of Anxiety treated with Xanax  1 mg twice daily as needed.  History of Vitamin-D Deficiency managed with Vitamin-D 50,000 units weekly.   Past Medical History:  Diagnosis Date   Chronic kidney disease    Diabetes mellitus    IDDM   Hyperlipidemia    Hypertension   No Known Allergies  Family History  Problem Relation Age of Onset   Cancer Mother     Diabetes Mother    Social Hx: Widowed. One daughter resides with her. Nonsmoker. No alcohol consumption. Drives. Has 3 adult daughters.   Review of Systems  Constitutional:  Negative for fever and malaise/fatigue.  HENT:  Negative for congestion.   Eyes:  Negative for blurred vision.  Respiratory:  Negative for cough and shortness of breath.   Cardiovascular:  Negative for chest pain, palpitations and leg swelling.  Gastrointestinal:  Negative for vomiting.  Musculoskeletal:  Negative for back pain.  Skin:  Negative for rash.  Neurological:  Negative for loss of consciousness and headaches.     Objective:  Vitals: BP 138/70   Pulse 84   Ht 5' 2 (1.575 m)   Wt 175 lb (79.4 kg)   SpO2 98%   BMI 32.01 kg/m   Physical Exam Vitals and nursing note reviewed.  Constitutional:      General: She is not in acute distress.    Appearance: Normal appearance. She is not toxic-appearing.  HENT:     Head: Normocephalic and atraumatic.   Cardiovascular:     Rate and Rhythm: Normal rate and regular rhythm. No extrasystoles are present.    Pulses: Normal pulses.          Dorsalis pedis pulses are 2+ on the right side and 2+ on the left side.       Posterior tibial pulses are 2+ on the right side and 2+ on the left side.     Heart sounds: Murmur (unchanged) heard.     No friction rub. No  gallop.  Pulmonary:     Effort: Pulmonary effort is normal. No respiratory distress.     Breath sounds: Normal breath sounds. No wheezing or rales.   Musculoskeletal:     Right foot: No deformity.     Left foot: No deformity.  Feet:     Right foot:     Skin integrity: Skin integrity normal. No ulcer, blister, skin breakdown, erythema, warmth, callus, dry skin or fissure.     Left foot:     Skin integrity: Skin integrity normal. No ulcer, blister, skin breakdown, erythema, warmth, callus, dry skin or fissure.   Skin:    General: Skin is warm and dry.   Neurological:     Mental Status: She is  alert and oriented to person, place, and time. Mental status is at baseline.   Psychiatric:        Mood and Affect: Mood normal.        Behavior: Behavior normal.        Thought Content: Thought content normal.        Judgment: Judgment normal.     Results:  Studies Obtained And Personally Reviewed By Me:  Diabetic Foot Exam - Simple   Simple Foot Form Diabetic Foot exam was performed with the following findings: Yes 07/14/2023 10:38 AM  Visual Inspection No deformities, no ulcerations, no other skin breakdown bilaterally: Yes Sensation Testing Intact to touch and monofilament testing bilaterally: Yes Pulse Check Posterior Tibialis and Dorsalis pulse intact bilaterally: Yes Comments    Labs:     Component Value Date/Time   NA 138 07/12/2023 0922   K 5.1 07/12/2023 0922   CL 104 07/12/2023 0922   CO2 26 07/12/2023 0922   GLUCOSE 159 (H) 07/12/2023 0922   BUN 17 07/12/2023 0922   CREATININE 1.35 (H) 07/12/2023 0922   CALCIUM  9.6 07/12/2023 0922   CALCIUM  7.8 (L) 04/01/2008 0455   PROT 6.5 07/12/2023 0922   ALBUMIN 4.3 04/20/2016 1145   AST 15 07/12/2023 0922   ALT 17 07/12/2023 0922   ALKPHOS 84 04/20/2016 1145   BILITOT 1.0 07/12/2023 0922   GFRNONAA 29 (L) 11/27/2019 0917   GFRAA 34 (L) 11/27/2019 0917    Lab Results  Component Value Date   WBC 6.2 01/11/2023   HGB 12.8 01/11/2023   HCT 42.1 01/11/2023   MCV 79.7 (L) 01/11/2023   PLT 360 01/11/2023   Lab Results  Component Value Date   CHOL 141 07/12/2023   HDL 44 (L) 07/12/2023   LDLCALC 77 07/12/2023   TRIG 112 07/12/2023   CHOLHDL 3.2 07/12/2023   Lab Results  Component Value Date   HGBA1C 7.7 (H) 07/12/2023    Lab Results  Component Value Date   TSH 2.10 01/11/2023   Assessment & Plan:   Hypertension treated with Caduet  10-40 mg daily, Carvedilol  6.25 mg twice daily. Dependent Edema treated with Lasix  40 mg daily. Blood Pressure: elevated today at 160/100,  18 minutes later decreased to  140/70, and 3 minutes following that normalized to 138/70.   Hyperlipidemia with 07/12/2023 Lipid Panel: HDL 44, elevated from 41 in 01/2023.   Diabetes Mellitus, type II treated with Novolog  7 units injected three times daily before meals and Lantus  50 units injected nightly. 07/12/2023 HgbA1c 7.7%, elevated from 7.1; Glucose 159, elevated from 129 in 01/2023 - says that when she checks her glucose it ranges between 77 - 120. UTD on eye exam through [facility] on 01/13/2023. Says that she has  occasional episodes of hypoglycemia, but doesn't notice any side effects.   Chronic Kidney Disease, stage 3b with Creatinine 1.35 which is decreased from 01/2023.   Anxiety treated with Xanax  1 mg twice daily as needed.  Vitamin-D Deficiency managed with Vitamin-D 50,000 units weekly.   Return in about 6 months (around 01/24/2024) for for annual labs and then on 01/27/2024 for annual visit.    I,Erin [name],acting as a Neurosurgeon for Erin Evener, MD.,have documented all relevant documentation on the behalf of Erin Evener, MD,as directed by  Erin Evener, MD while in the presence of Erin Evener, MD.   I, Erin Evener, MD, have reviewed all documentation for this visit. The documentation on 07/17/23 for the exam, diagnosis, procedures, and orders are all accurate and complete.

## 2023-07-13 LAB — COMPLETE METABOLIC PANEL WITHOUT GFR
AG Ratio: 2.1 (calc) (ref 1.0–2.5)
ALT: 17 U/L (ref 6–29)
AST: 15 U/L (ref 10–35)
Albumin: 4.4 g/dL (ref 3.6–5.1)
Alkaline phosphatase (APISO): 102 U/L (ref 37–153)
BUN/Creatinine Ratio: 13 (calc) (ref 6–22)
BUN: 17 mg/dL (ref 7–25)
CO2: 26 mmol/L (ref 20–32)
Calcium: 9.6 mg/dL (ref 8.6–10.4)
Chloride: 104 mmol/L (ref 98–110)
Creat: 1.35 mg/dL — ABNORMAL HIGH (ref 0.60–1.00)
Globulin: 2.1 g/dL (ref 1.9–3.7)
Glucose, Bld: 159 mg/dL — ABNORMAL HIGH (ref 65–99)
Potassium: 5.1 mmol/L (ref 3.5–5.3)
Sodium: 138 mmol/L (ref 135–146)
Total Bilirubin: 1 mg/dL (ref 0.2–1.2)
Total Protein: 6.5 g/dL (ref 6.1–8.1)

## 2023-07-13 LAB — HEMOGLOBIN A1C
Hgb A1c MFr Bld: 7.7 % — ABNORMAL HIGH (ref <5.7)
Mean Plasma Glucose: 174 mg/dL
eAG (mmol/L): 9.7 mmol/L

## 2023-07-13 LAB — LIPID PANEL
Cholesterol: 141 mg/dL (ref <200)
HDL: 44 mg/dL — ABNORMAL LOW (ref >=50)
LDL Cholesterol (Calc): 77 mg/dL
Non-HDL Cholesterol (Calc): 97 mg/dL (ref <130)
Total CHOL/HDL Ratio: 3.2 (calc) (ref <5.0)
Triglycerides: 112 mg/dL (ref <150)

## 2023-07-13 LAB — MICROALBUMIN / CREATININE URINE RATIO
Creatinine, Urine: 87 mg/dL (ref 20–275)
Microalb Creat Ratio: 15 mg/g{creat} (ref <30)
Microalb, Ur: 1.3 mg/dL

## 2023-07-14 ENCOUNTER — Ambulatory Visit (INDEPENDENT_AMBULATORY_CARE_PROVIDER_SITE_OTHER): Payer: Medicare Other | Admitting: Internal Medicine

## 2023-07-14 ENCOUNTER — Encounter: Payer: Self-pay | Admitting: Internal Medicine

## 2023-07-14 VITALS — BP 138/70 | HR 84 | Ht 62.0 in | Wt 175.0 lb

## 2023-07-14 DIAGNOSIS — E785 Hyperlipidemia, unspecified: Secondary | ICD-10-CM | POA: Diagnosis not present

## 2023-07-14 DIAGNOSIS — E119 Type 2 diabetes mellitus without complications: Secondary | ICD-10-CM | POA: Diagnosis not present

## 2023-07-14 DIAGNOSIS — Z794 Long term (current) use of insulin: Secondary | ICD-10-CM | POA: Diagnosis not present

## 2023-07-14 DIAGNOSIS — I10 Essential (primary) hypertension: Secondary | ICD-10-CM | POA: Diagnosis not present

## 2023-07-14 DIAGNOSIS — N1832 Chronic kidney disease, stage 3b: Secondary | ICD-10-CM

## 2023-07-14 DIAGNOSIS — E1169 Type 2 diabetes mellitus with other specified complication: Secondary | ICD-10-CM | POA: Diagnosis not present

## 2023-07-17 NOTE — Patient Instructions (Signed)
 It was a pleasure to see you today. Offered Endocrinology referral but patient declines at this time.Continue to watch diet. Return for annual wellness visit in 6 months. No change in meds.

## 2023-07-20 ENCOUNTER — Other Ambulatory Visit: Payer: Self-pay | Admitting: Internal Medicine

## 2023-07-29 ENCOUNTER — Other Ambulatory Visit: Payer: Self-pay

## 2023-07-29 ENCOUNTER — Other Ambulatory Visit: Payer: Self-pay | Admitting: Internal Medicine

## 2023-07-29 DIAGNOSIS — R609 Edema, unspecified: Secondary | ICD-10-CM

## 2023-07-29 MED ORDER — FUROSEMIDE 40 MG PO TABS: 40.0000 mg | ORAL_TABLET | Freq: Every day | ORAL | 3 refills | Status: AC

## 2023-07-30 ENCOUNTER — Other Ambulatory Visit: Payer: Self-pay | Admitting: Internal Medicine

## 2023-08-17 DIAGNOSIS — H40013 Open angle with borderline findings, low risk, bilateral: Secondary | ICD-10-CM | POA: Diagnosis not present

## 2023-08-17 DIAGNOSIS — H52222 Regular astigmatism, left eye: Secondary | ICD-10-CM | POA: Diagnosis not present

## 2023-08-17 DIAGNOSIS — H1789 Other corneal scars and opacities: Secondary | ICD-10-CM | POA: Diagnosis not present

## 2023-08-17 DIAGNOSIS — H40053 Ocular hypertension, bilateral: Secondary | ICD-10-CM | POA: Diagnosis not present

## 2023-08-17 DIAGNOSIS — H524 Presbyopia: Secondary | ICD-10-CM | POA: Diagnosis not present

## 2023-08-17 DIAGNOSIS — E119 Type 2 diabetes mellitus without complications: Secondary | ICD-10-CM | POA: Diagnosis not present

## 2023-08-17 LAB — HM DIABETES EYE EXAM

## 2023-08-18 ENCOUNTER — Encounter: Payer: Self-pay | Admitting: Internal Medicine

## 2023-08-18 ENCOUNTER — Ambulatory Visit: Payer: Self-pay | Admitting: Internal Medicine

## 2023-09-26 ENCOUNTER — Telehealth: Payer: Self-pay

## 2023-09-26 NOTE — Telephone Encounter (Signed)
 Copied from CRM #[id number]. Topic: General - Other >> Sep 26, 2023  4:20 PM Sasha M wrote: Reason for CRM: pt daughter called in to let Dr Perri know that mother had a low sugar episode that dropped to 46. EMTs were called and sugar came back up to 207 so no hospital visit was needed. [name] Lias [phone], call if needed

## 2023-09-28 NOTE — Telephone Encounter (Signed)
 Patient took her insulin  before she headed to a party and ate late. Daughter declined referral to endocrinology at this time.

## 2023-10-11 ENCOUNTER — Other Ambulatory Visit: Payer: Self-pay

## 2023-10-11 MED ORDER — VITAMIN D (ERGOCALCIFEROL) 1.25 MG (50000 UNIT) PO CAPS: 50000.0000 [IU] | ORAL_CAPSULE | ORAL | 3 refills | Status: AC

## 2023-10-17 ENCOUNTER — Other Ambulatory Visit: Payer: Self-pay | Admitting: Internal Medicine

## 2023-10-28 ENCOUNTER — Other Ambulatory Visit: Payer: Self-pay | Admitting: Internal Medicine

## 2023-11-17 DIAGNOSIS — H1789 Other corneal scars and opacities: Secondary | ICD-10-CM | POA: Diagnosis not present

## 2023-11-17 DIAGNOSIS — H40053 Ocular hypertension, bilateral: Secondary | ICD-10-CM | POA: Diagnosis not present

## 2023-11-17 DIAGNOSIS — H40013 Open angle with borderline findings, low risk, bilateral: Secondary | ICD-10-CM | POA: Diagnosis not present

## 2023-11-17 DIAGNOSIS — H25813 Combined forms of age-related cataract, bilateral: Secondary | ICD-10-CM | POA: Diagnosis not present

## 2023-12-06 ENCOUNTER — Other Ambulatory Visit: Payer: Self-pay | Admitting: Internal Medicine

## 2023-12-15 ENCOUNTER — Telehealth: Payer: Self-pay | Admitting: Internal Medicine

## 2024-01-11 ENCOUNTER — Ambulatory Visit: Admitting: Internal Medicine

## 2024-01-11 VITALS — BP 150/80 | HR 76 | Ht 62.0 in | Wt 175.0 lb

## 2024-01-11 DIAGNOSIS — Z23 Encounter for immunization: Secondary | ICD-10-CM | POA: Diagnosis not present

## 2024-01-11 DIAGNOSIS — I10 Essential (primary) hypertension: Secondary | ICD-10-CM

## 2024-01-11 NOTE — Progress Notes (Addendum)
 Patient presents to the clinic for a flu vaccine. Patient received a flu vaccine L deltoid IM. Patient tolerated well.   IRonal JINNY Hailstone, MD, have reviewed all documentation for this visit. The documentation on 01/11/2024 for the exam, diagnosis, procedures, and orders are all accurate and complete.

## 2024-01-12 ENCOUNTER — Ambulatory Visit

## 2024-01-12 NOTE — Telephone Encounter (Signed)
 done

## 2024-01-19 ENCOUNTER — Telehealth: Payer: Self-pay | Admitting: Internal Medicine

## 2024-01-19 NOTE — Progress Notes (Incomplete)
 Annual Wellness Visit   Patient Care Team: Baxley, Ronal PARAS, MD as PCP - General (Internal Medicine) Kennyth Cy RAMAN, DO (Optometry)  Visit Date: 01/19/2024   No chief complaint on file.  Subjective:  Patient: Erin [name], Female DOB: [birth year], 78 y.o. MRN: [id number] There were no vitals filed for this visit. Erin [name] is a 78 y.o. Female who presents today for her Annual Wellness Visit. Patient has Hypertension; Anxiety; Chronic kidney disease, stage IV (severe) (HCC); Hyperlipidemia; Insulin  dependent diabetes mellitus; and Metabolic syndrome on their problem list.  History of Type 2 diabetes mellitus treated with Novolog  7 units three times daily, Lantus  50 units daily. HGBA1c at ***   History of hypertension treated with carvedilol  6.25 mg twice daily, furosemide  40 mg daily, Caduet  10-40 mg daily. Blood pressure ***   History of Vitamin D  deficiency treated with Drisdol  50,000 units weekly.   History of anxiety treated with alprazolam  1 mg twice daily as needed.   Has annual diabetic eye exam at [facility].  History of corneal edema and elevated intraocular pressures.  Has thick corneas.  Findings apparently are borderline and felt to be low risk bilaterally. Reports she is UTD on annual eye exam.   She was admitted to the hospital in 2010 with renal failure.  At that time she was in diabetic ketoacidosis with hyperosmolar state.  Serum glucose was 1212.  Serum creatinine was 4 and she was volume depleted.  Creatinine never returned to normal after that.  She has chronic kidney disease which is stable.     Longstanding history of hypertension which was present prior to that admission.    Labs ***/***/*** {Labs (Optional):31667}     Health Maintenance  Topic Date Due   Medicare Annual Wellness (AWV)  01/13/2024   HEMOGLOBIN A1C  01/11/2024   Bone Density Scan  07/13/2024 (Originally 02/25/2010)   Diabetic kidney evaluation - eGFR measurement  07/11/2024    Diabetic kidney evaluation - Urine ACR  07/11/2024   FOOT EXAM  07/13/2024   OPHTHALMOLOGY EXAM  08/16/2024   DTaP/Tdap/Td (3 - Td or Tdap) 07/11/2032   Pneumococcal Vaccine: 50+ Years  Completed   Influenza Vaccine  Completed   Zoster Vaccines- Shingrix  Completed   Meningococcal B Vaccine  Aged Out   Mammogram  Discontinued   COVID-19 Vaccine  Discontinued   Hepatitis C Screening  Discontinued    {Man or Woman:32389}  Vaccine Counseling: Due for {Vaccines:32291::Influenza}; UTD on {Vaccines:32291::Influenza}  ROS Objective:  Vitals: body mass index is unknown because there is no height or weight on file.There were no vitals filed for this visit. Physical Exam  Current Outpatient Medications  Medication Instructions   ALPRAZolam  (XANAX ) 1 mg, Oral, 2 times daily PRN, for anxiety   CADUET  10-40 MG tablet 1 tablet, Oral, Daily   carvedilol  (COREG ) 6.25 mg, Oral, 2 times daily with meals   furosemide  (LASIX ) 40 mg, Oral, Daily   furosemide  (LASIX ) 40 mg, Oral, Daily   insulin  aspart (NOVOLOG ) 100 UNIT/ML injection INJECT 7 UNITS INTO THE SKIN 3 TIMES A DAY BEFORE MEALS   insulin  glargine (LANTUS ) 100 UNIT/ML injection Inject 50 units subcutaneously daily at 10 pm   Polyethyl Glycol-Propyl Glycol (SYSTANE OP) Ophthalmic   Vitamin D  (Ergocalciferol ) (DRISDOL ) 50,000 Units, Oral, Weekly   Past Medical History:  Diagnosis Date   Chronic kidney disease    Diabetes mellitus    IDDM   Hyperlipidemia    Hypertension  Medical/Surgical History Narrative:  Allergic/Intolerant to: Allergies[1] *** - ***  *** - ***  *** - ***  *** - ***  *** - ***  *** - ***  *** - ***  *** - *** Other - Hx of: *** ; Surghx of: *** No past surgical history on file. Family History  Problem Relation Age of Onset   Cancer Mother    Diabetes Mother    Family History Narrative: {ELFamHX:31110} Social History   Social History Narrative   Not on file   Most Recent Health Risks  Assessment:   Most Recent Social Determinants of Health (Including Hx of Tobacco, Alcohol, and Drug Use) SDOH Screenings   Food Insecurity: No Food Insecurity (01/13/2023)  Housing: Unknown (01/13/2023)  Transportation Needs: No Transportation Needs (01/13/2023)  Utilities: Not At Risk (01/13/2023)  Alcohol Screen: Low Risk (01/13/2023)  Depression (PHQ2-9): Low Risk (01/13/2023)  Financial Resource Strain: Low Risk (01/13/2023)  Physical Activity: Insufficiently Active (01/13/2023)  Social Connections: Moderately Integrated (01/13/2023)  Stress: No Stress Concern Present (01/13/2023)  Tobacco Use: Low Risk (01/11/2024)  Health Literacy: Adequate Health Literacy (01/13/2023)   Social History[2] Most Recent Functional Status Assessment:     No data to display         Most Recent Fall Risk Assessment:    01/13/2023   10:10 AM  Fall Risk   Falls in the past year? 0  Number falls in past yr: 0  Injury with Fall? 0   Risk for fall due to : No Fall Risks  Follow up Falls prevention discussed;Education provided;Falls evaluation completed     Data saved with a previous flowsheet row definition   Most Recent Anxiety/Depression Screenings:    01/13/2023   10:19 AM 01/06/2022   11:15 AM  PHQ 2/9 Scores  PHQ - 2 Score 0 0       No data to display         Most Recent Cognitive Screening:    01/13/2023   10:05 AM  6CIT Screen  What Year? 0 points  What month? 0 points  What time? 0 points  Count back from 20 0 points  Months in reverse 0 points  Repeat phrase 0 points  Total Score 0 points   Most Recent Vision/Hearing Screenings:No results found. Results:  Studies Obtained And Personally Reviewed By Me: Diabetic Foot Exam - Simple   No data filed     {Imaging, colonoscopy, mammogram, bone density scan, echocardiogram, heart cath, stress test, CT calcium  score, etc.:32292}  Labs:  CBC w/ Differential Lab Results  Component Value Date   WBC 6.2  01/11/2023   RBC 5.28 (H) 01/11/2023   HGB 12.8 01/11/2023   HCT 42.1 01/11/2023   PLT 360 01/11/2023   MCV 79.7 (L) 01/11/2023   MCH 24.2 (L) 01/11/2023   MCHC 30.4 (L) 01/11/2023   RDW 14.9 01/11/2023   MPV 11.2 01/11/2023   LYMPHSABS 2,516 07/06/2022   MONOABS 570 10/10/2015   BASOSABS 31 01/11/2023    Comprehensive Metabolic Panel Lab Results  Component Value Date   NA 138 07/12/2023   K 5.1 07/12/2023   CL 104 07/12/2023   CO2 26 07/12/2023   GLUCOSE 159 (H) 07/12/2023   BUN 17 07/12/2023   CREATININE 1.35 (H) 07/12/2023   CALCIUM  9.6 07/12/2023   PROT 6.5 07/12/2023   ALBUMIN 4.3 04/20/2016   AST 15 07/12/2023   ALT 17 07/12/2023   ALKPHOS 84 04/20/2016   BILITOT 1.0 07/12/2023   EGFR  33 (L) 01/11/2023   GFRNONAA 29 (L) 11/27/2019   Lipid Panel  Lab Results  Component Value Date   CHOL 141 07/12/2023   HDL 44 (L) 07/12/2023   LDLCALC 77 07/12/2023   TRIG 112 07/12/2023   A1c Lab Results  Component Value Date   HGBA1C 7.7 (H) 07/12/2023    TSH Lab Results  Component Value Date   TSH 2.10 01/11/2023   PSA{PSA (Optional):32132} No results found for any visits on 01/31/24. Assessment & Plan:  No orders of the defined types were placed in this encounter.  No orders of the defined types were placed in this encounter.  Other Labs Reviewed today:    No follow-ups on file.   Annual Wellness Visit done today including the all of the following: Reviewed patient's Family Medical History Reviewed patient's SDOH and reviewed tobacco, alcohol, and drug use.  Reviewed and updated list of patient's medical providers Assessment of cognitive impairment was done Assessed patient's functional ability Established a written schedule for health screening services Health Risk Assessent Completed and Reviewed  Discussed health benefits of physical activity, and encouraged her to engage in regular exercise appropriate for her age and condition.   I,Makayla C  Reid,acting as a scribe for Ronal JINNY Hailstone, MD.,have documented all relevant documentation on the behalf of Ronal JINNY Hailstone, MD,as directed by  Ronal JINNY Hailstone, MD while in the presence of Ronal JINNY Hailstone, MD.  I, Ronal JINNY Hailstone, MD, have reviewed all documentation for and agree with the above Annual Wellness Visit documentation.  Ronal JINNY Hailstone, MD Internal Medicine 01/31/2024    [1] No Known Allergies [2]  Social History Tobacco Use   Smoking status: Never   Smokeless tobacco: Never  Substance Use Topics   Alcohol use: No   Drug use: No

## 2024-01-23 NOTE — Telephone Encounter (Signed)
 Done

## 2024-01-24 ENCOUNTER — Other Ambulatory Visit: Payer: Self-pay

## 2024-01-24 DIAGNOSIS — E119 Type 2 diabetes mellitus without complications: Secondary | ICD-10-CM

## 2024-01-24 DIAGNOSIS — I10 Essential (primary) hypertension: Secondary | ICD-10-CM

## 2024-01-24 DIAGNOSIS — E785 Hyperlipidemia, unspecified: Secondary | ICD-10-CM

## 2024-01-24 DIAGNOSIS — Z1329 Encounter for screening for other suspected endocrine disorder: Secondary | ICD-10-CM

## 2024-01-24 DIAGNOSIS — Z0000 Encounter for general adult medical examination without abnormal findings: Secondary | ICD-10-CM

## 2024-01-24 DIAGNOSIS — N1832 Chronic kidney disease, stage 3b: Secondary | ICD-10-CM

## 2024-01-24 NOTE — Addendum Note (Signed)
 Addended by: Lexy Meininger P on: 01/24/2024 09:34 AM   Modules accepted: Orders

## 2024-01-25 LAB — CBC WITH DIFFERENTIAL/PLATELET
Absolute Lymphocytes: 2412 {cells}/uL (ref 850–3900)
Absolute Monocytes: 507 {cells}/uL (ref 200–950)
Basophils Absolute: 33 {cells}/uL (ref 0–200)
Basophils Relative: 0.5 %
Eosinophils Absolute: 72 {cells}/uL (ref 15–500)
Eosinophils Relative: 1.1 %
HCT: 42.3 % (ref 35.9–46.0)
Hemoglobin: 12.9 g/dL (ref 11.7–15.5)
MCH: 24.5 pg — ABNORMAL LOW (ref 27.0–33.0)
MCHC: 30.5 g/dL — ABNORMAL LOW (ref 31.6–35.4)
MCV: 80.3 fL — ABNORMAL LOW (ref 81.4–101.7)
MPV: 11.5 fL (ref 7.5–12.5)
Monocytes Relative: 7.8 %
Neutro Abs: 3478 {cells}/uL (ref 1500–7800)
Neutrophils Relative %: 53.5 %
Platelets: 391 Thousand/uL (ref 140–400)
RBC: 5.27 Million/uL — ABNORMAL HIGH (ref 3.80–5.10)
RDW: 14.6 % (ref 11.0–15.0)
Total Lymphocyte: 37.1 %
WBC: 6.5 Thousand/uL (ref 3.8–10.8)

## 2024-01-25 LAB — COMPREHENSIVE METABOLIC PANEL WITH GFR
AG Ratio: 2 (calc) (ref 1.0–2.5)
ALT: 18 U/L (ref 6–29)
AST: 14 U/L (ref 10–35)
Albumin: 4.5 g/dL (ref 3.6–5.1)
Alkaline phosphatase (APISO): 116 U/L (ref 37–153)
BUN/Creatinine Ratio: 12 (calc) (ref 6–22)
BUN: 18 mg/dL (ref 7–25)
CO2: 27 mmol/L (ref 20–32)
Calcium: 9.6 mg/dL (ref 8.6–10.4)
Chloride: 103 mmol/L (ref 98–110)
Creat: 1.45 mg/dL — ABNORMAL HIGH (ref 0.60–1.00)
Globulin: 2.3 g/dL (ref 1.9–3.7)
Glucose, Bld: 193 mg/dL — ABNORMAL HIGH (ref 65–99)
Potassium: 4.9 mmol/L (ref 3.5–5.3)
Sodium: 138 mmol/L (ref 135–146)
Total Bilirubin: 0.8 mg/dL (ref 0.2–1.2)
Total Protein: 6.8 g/dL (ref 6.1–8.1)
eGFR: 37 mL/min/1.73m2 — ABNORMAL LOW

## 2024-01-25 LAB — TSH: TSH: 1.98 m[IU]/L (ref 0.40–4.50)

## 2024-01-25 LAB — LIPID PANEL
Cholesterol: 127 mg/dL
HDL: 46 mg/dL — ABNORMAL LOW
LDL Cholesterol (Calc): 65 mg/dL
Non-HDL Cholesterol (Calc): 81 mg/dL
Total CHOL/HDL Ratio: 2.8 (calc)
Triglycerides: 82 mg/dL

## 2024-01-25 LAB — MICROALBUMIN / CREATININE URINE RATIO
Creatinine, Urine: 64 mg/dL (ref 20–275)
Microalb Creat Ratio: 17 mg/g{creat}
Microalb, Ur: 1.1 mg/dL

## 2024-01-25 LAB — HEMOGLOBIN A1C
Hgb A1c MFr Bld: 7.6 % — ABNORMAL HIGH
Mean Plasma Glucose: 171 mg/dL
eAG (mmol/L): 9.5 mmol/L

## 2024-01-27 ENCOUNTER — Encounter: Payer: Self-pay | Admitting: Internal Medicine

## 2024-01-30 ENCOUNTER — Ambulatory Visit: Admitting: Internal Medicine

## 2024-01-30 ENCOUNTER — Encounter: Payer: Self-pay | Admitting: Internal Medicine

## 2024-01-30 VITALS — BP 140/90 | Ht 61.0 in | Wt 174.0 lb

## 2024-01-30 DIAGNOSIS — N1832 Chronic kidney disease, stage 3b: Secondary | ICD-10-CM | POA: Diagnosis not present

## 2024-01-30 DIAGNOSIS — E119 Type 2 diabetes mellitus without complications: Secondary | ICD-10-CM

## 2024-01-30 DIAGNOSIS — E1122 Type 2 diabetes mellitus with diabetic chronic kidney disease: Secondary | ICD-10-CM | POA: Diagnosis not present

## 2024-01-30 DIAGNOSIS — Z794 Long term (current) use of insulin: Secondary | ICD-10-CM | POA: Diagnosis not present

## 2024-01-30 DIAGNOSIS — F419 Anxiety disorder, unspecified: Secondary | ICD-10-CM | POA: Diagnosis not present

## 2024-01-30 DIAGNOSIS — I10 Essential (primary) hypertension: Secondary | ICD-10-CM

## 2024-01-30 DIAGNOSIS — E559 Vitamin D deficiency, unspecified: Secondary | ICD-10-CM | POA: Diagnosis not present

## 2024-01-30 DIAGNOSIS — Z0000 Encounter for general adult medical examination without abnormal findings: Secondary | ICD-10-CM

## 2024-01-30 DIAGNOSIS — Z6832 Body mass index (BMI) 32.0-32.9, adult: Secondary | ICD-10-CM

## 2024-01-30 NOTE — Progress Notes (Signed)
 "  Annual Wellness Visit   Patient Care Team: Keshara Kiger, Ronal PARAS, MD as PCP - General (Internal Medicine) Kennyth Cy RAMAN, DO (Optometry)  Visit Date: 01/30/2024   Chief Complaint  Patient presents with   Annual Exam   Medicare Wellness   Subjective:  Patient: Erin [name], Female DOB: August 19, 1945, 78 y.o. MRN: [id number] Vitals:   01/30/24 1235  BP: (!) 190/98   Erin [name] is a 78 y.o. Female who presents today for her Annual Wellness Visit. Patient has Hypertension; Anxiety; Chronic kidney disease, stage IV (severe) (HCC); Hyperlipidemia; Insulin  dependent diabetes mellitus; and Metabolic syndrome on their problem list.  History of Type 2 diabetes mellitus treated with Novolog  7 units three times daily, Lantus  50 units daily. HGBA1c at 7.6%   History of hypertension treated with carvedilol  6.25 mg twice daily, furosemide  40 mg daily, Caduet  10-40 mg daily. Blood pressure is slightly elevated at 140/80. Said that she did not take her furosemide  today.    History of Vitamin D  deficiency treated with Drisdol  50,000 units weekly.   History of anxiety treated with alprazolam  1 mg twice daily as needed.   Has annual diabetic eye exam at [facility].  History of corneal edema and elevated intraocular pressures.  Has thick corneas.  Findings apparently are borderline and felt to be low risk bilaterally. Reports she is UTD on annual eye exam.   She was admitted to the hospital in 2010 with renal failure.  At that time she was in diabetic ketoacidosis with hyperosmolar state.  Serum glucose was 1212.  Serum creatinine was 4 and she was volume depleted.  Creatinine never returned to normal after that.  She has chronic kidney disease which is stable.     Longstanding history of hypertension which was present prior to that admission.    Labs 01/24/2024  RBC 5.27, MCV 80.3, MCH 24.5, MCHC 30.5, Blood glucose 193, Creatinine 1.45, eGFR 37, HgbA1c 7.6%, HDL 46, Otherwise WNL.      Health Maintenance  Topic Date Due   Medicare Annual Wellness (AWV)  01/13/2024   Bone Density Scan  07/13/2024 (Originally 02/25/2010)   FOOT EXAM  07/13/2024   HEMOGLOBIN A1C  07/24/2024   OPHTHALMOLOGY EXAM  08/16/2024   Diabetic kidney evaluation - eGFR measurement  01/23/2025   Diabetic kidney evaluation - Urine ACR  01/23/2025   DTaP/Tdap/Td (3 - Td or Tdap) 07/11/2032   Pneumococcal Vaccine: 50+ Years  Completed   Influenza Vaccine  Completed   Zoster Vaccines- Shingrix  Completed   Meningococcal B Vaccine  Aged Out   Mammogram  Discontinued   COVID-19 Vaccine  Discontinued   Hepatitis C Screening  Discontinued    Review of Systems  Constitutional:  Negative for fever and malaise/fatigue.  HENT:  Negative for congestion.   Eyes:  Negative for blurred vision.  Respiratory:  Negative for cough and shortness of breath.   Cardiovascular:  Negative for chest pain, palpitations and leg swelling.  Gastrointestinal:  Negative for vomiting.  Musculoskeletal:  Negative for back pain.  Skin:  Negative for rash.  Neurological:  Negative for loss of consciousness and headaches.   Objective:  Vitals: body mass index is 32.88 kg/m. Today's Vitals   01/30/24 1235  BP: (!) 190/98  Weight: 174 lb (78.9 kg)  Height: 5' 1 (1.549 m)  PainSc: 0-No pain   Physical Exam Vitals and nursing note reviewed.  Constitutional:      General: She is not in acute distress.  Appearance: Normal appearance. She is not ill-appearing or toxic-appearing.  HENT:     Head: Normocephalic and atraumatic.     Right Ear: Hearing, tympanic membrane, ear canal and external ear normal.     Left Ear: Hearing, tympanic membrane, ear canal and external ear normal.     Mouth/Throat:     Pharynx: Oropharynx is clear.  Eyes:     Extraocular Movements: Extraocular movements intact.     Pupils: Pupils are equal, round, and reactive to light.  Neck:     Thyroid : No thyroid  mass, thyromegaly or thyroid   tenderness.     Vascular: No carotid bruit.  Cardiovascular:     Rate and Rhythm: Normal rate and regular rhythm. No extrasystoles are present.    Pulses:          Dorsalis pedis pulses are 2+ on the right side and 2+ on the left side.     Heart sounds: Normal heart sounds. No murmur heard.    No friction rub. No gallop.  Pulmonary:     Effort: Pulmonary effort is normal.     Breath sounds: Normal breath sounds. No decreased breath sounds, wheezing, rhonchi or rales.  Chest:     Chest wall: No mass.  Abdominal:     Palpations: Abdomen is soft. There is no hepatomegaly, splenomegaly or mass.     Tenderness: There is no abdominal tenderness.     Hernia: No hernia is present.  Musculoskeletal:     Cervical back: Normal range of motion.     Right lower leg: No edema.     Left lower leg: No edema.  Feet:     Comments: Positional sense and sensation intact. No ulcerations Lymphadenopathy:     Cervical: No cervical adenopathy.     Upper Body:     Right upper body: No supraclavicular adenopathy.     Left upper body: No supraclavicular adenopathy.  Skin:    General: Skin is warm and dry.  Neurological:     General: No focal deficit present.     Mental Status: She is alert and oriented to person, place, and time. Mental status is at baseline.     Sensory: Sensation is intact.     Motor: Motor function is intact. No weakness.     Deep Tendon Reflexes: Reflexes are normal and symmetric.  Psychiatric:        Attention and Perception: Attention normal.        Mood and Affect: Mood normal.        Speech: Speech normal.        Behavior: Behavior normal.        Thought Content: Thought content normal.        Cognition and Memory: Cognition normal.        Judgment: Judgment normal.     Current Outpatient Medications  Medication Instructions   ALPRAZolam  (XANAX ) 1 mg, Oral, 2 times daily PRN, for anxiety   CADUET  10-40 MG tablet 1 tablet, Oral, Daily   carvedilol  (COREG ) 6.25 mg,  Oral, 2 times daily with meals   furosemide  (LASIX ) 40 mg, Oral, Daily   furosemide  (LASIX ) 40 mg, Oral, Daily   insulin  aspart (NOVOLOG ) 100 UNIT/ML injection INJECT 7 UNITS INTO THE SKIN 3 TIMES A DAY BEFORE MEALS   insulin  glargine (LANTUS ) 100 UNIT/ML injection Inject 50 units subcutaneously daily at 10 pm   Polyethyl Glycol-Propyl Glycol (SYSTANE OP) Apply to eye.   Vitamin D  (Ergocalciferol ) (DRISDOL ) 50,000 Units, Oral, Weekly  Past Medical History:  Diagnosis Date   Chronic kidney disease    Diabetes mellitus    IDDM   Hyperlipidemia    Hypertension    Medical/Surgical History Narrative:  Allergic/Intolerant to: Allergies[1] No past surgical history on file. Family History  Problem Relation Age of Onset   Cancer Mother    Diabetes Mother    Social History   Social History Narrative   Not on file   Most Recent Health Risks Assessment:   Most Recent Social Determinants of Health (Including Hx of Tobacco, Alcohol, and Drug Use) SDOH Screenings   Food Insecurity: No Food Insecurity (01/30/2024)  Housing: Low Risk (01/30/2024)  Transportation Needs: No Transportation Needs (01/13/2023)  Utilities: Not At Risk (01/13/2023)  Alcohol Screen: Low Risk (01/13/2023)  Depression (PHQ2-9): Low Risk (01/30/2024)  Financial Resource Strain: Low Risk (01/13/2023)  Physical Activity: Insufficiently Active (01/13/2023)  Social Connections: Moderately Integrated (01/13/2023)  Stress: No Stress Concern Present (01/13/2023)  Tobacco Use: Low Risk (01/30/2024)  Health Literacy: Adequate Health Literacy (01/13/2023)   Social History[2] Most Recent Functional Status Assessment:     No data to display         Most Recent Fall Risk Assessment:    01/30/2024   12:35 PM  Fall Risk   Falls in the past year? 0  Number falls in past yr: 0  Injury with Fall? 0  Risk for fall due to : No Fall Risks  Follow up Falls prevention discussed;Education provided;Falls evaluation  completed   Most Recent Anxiety/Depression Screenings:    01/30/2024   12:35 PM 01/13/2023   10:19 AM  PHQ 2/9 Scores  PHQ - 2 Score 0 0  PHQ- 9 Score 0     Most Recent Cognitive Screening:    01/13/2023   10:05 AM  6CIT Screen  What Year? 0 points  What month? 0 points  What time? 0 points  Count back from 20 0 points  Months in reverse 0 points  Repeat phrase 0 points  Total Score 0 points    Results:    Labs:  CBC w/ Differential Lab Results  Component Value Date   WBC 6.5 01/24/2024   RBC 5.27 (H) 01/24/2024   HGB 12.9 01/24/2024   HCT 42.3 01/24/2024   PLT 391 01/24/2024   MCV 80.3 (L) 01/24/2024   MCH 24.5 (L) 01/24/2024   MCHC 30.5 (L) 01/24/2024   RDW 14.6 01/24/2024   MPV 11.5 01/24/2024   LYMPHSABS 2,516 07/06/2022   MONOABS 570 10/10/2015   BASOSABS 33 01/24/2024    Comprehensive Metabolic Panel Lab Results  Component Value Date   NA 138 01/24/2024   K 4.9 01/24/2024   CL 103 01/24/2024   CO2 27 01/24/2024   GLUCOSE 193 (H) 01/24/2024   BUN 18 01/24/2024   CREATININE 1.45 (H) 01/24/2024   CALCIUM  9.6 01/24/2024   PROT 6.8 01/24/2024   ALBUMIN 4.3 04/20/2016   AST 14 01/24/2024   ALT 18 01/24/2024   ALKPHOS 84 04/20/2016   BILITOT 0.8 01/24/2024   EGFR 37 (L) 01/24/2024   GFRNONAA 29 (L) 11/27/2019   Lipid Panel  Lab Results  Component Value Date   CHOL 127 01/24/2024   HDL 46 (L) 01/24/2024   LDLCALC 65 01/24/2024   TRIG 82 01/24/2024   A1c Lab Results  Component Value Date   HGBA1C 7.6 (H) 01/24/2024    TSH Lab Results  Component Value Date   TSH 1.98 01/24/2024   Assessment & Plan:  Type 2 diabetes mellitus: treated with Novolog  7 units three times daily, Lantus  50 units daily. HGBA1c at 7.6% Have not sought tight control at her age. She continues to drive.   Essential Hypertension: treated with carvedilol  6.25 mg twice daily, furosemide  40 mg daily, Caduet  10-40 mg daily. Blood pressure is slightly elevated at  140/80. She states that she did not take her furosemide  today. Told to check her blood pressure several times a day for several weeks and call with readings    Vitamin D  deficiency: treated with Drisdol  50,000 units weekly.  CKD stage 3b- continue close follow up q 6 months   Anxiety: treated with Alprazolam  1 mg twice daily as needed.  Low HDL  BMI 32- continue diet and exercise efforts   RTC in 6 months. Continue current meds.  It was a pleasure to see you.   Annual Wellness Visit done today including the all of the following: Reviewed patient's Family Medical History Reviewed patient's SDOH and reviewed tobacco, alcohol, and drug use.  Reviewed and updated list of patient's medical providers Assessment of cognitive impairment was done Assessed patient's functional ability Established a written schedule for health screening services Health Risk Assessent Completed and Reviewed  Discussed health benefits of physical activity, and encouraged her to engage in regular exercise appropriate for her age and condition.   I,Makayla C Reid,acting as a scribe for Ronal JINNY Hailstone, MD.,have documented all relevant documentation on the behalf of Ronal JINNY Hailstone, MD,as directed by  Ronal JINNY Hailstone, MD while in the presence of Ronal JINNY Hailstone, MD.  I, Ronal JINNY Hailstone, MD, have reviewed all documentation for and agree with the above Annual Wellness Visit documentation.  Ronal JINNY Hailstone, MD Internal Medicine 01/30/2024     [1] No Known Allergies [2]  Social History Tobacco Use   Smoking status: Never   Smokeless tobacco: Never  Substance Use Topics   Alcohol use: No   Drug use: No   "

## 2024-01-31 ENCOUNTER — Ambulatory Visit: Admitting: Internal Medicine

## 2024-02-01 NOTE — Patient Instructions (Signed)
 To see you as always.  Please try to watch your diet a bit.  Continue current medications and follow-up in 6 months.

## 2024-02-08 ENCOUNTER — Other Ambulatory Visit: Payer: Self-pay | Admitting: Internal Medicine

## 2024-07-31 ENCOUNTER — Other Ambulatory Visit

## 2024-08-07 ENCOUNTER — Ambulatory Visit: Admitting: Internal Medicine
